# Patient Record
Sex: Male | Born: 1965 | Race: White | Hispanic: No | Marital: Married | Smoking: Former smoker
Health system: Southern US, Community
[De-identification: ages and names within clinical notes are randomized; demographics above are authoritative.]

## PROBLEM LIST (undated history)

## (undated) DIAGNOSIS — I4891 Unspecified atrial fibrillation: Secondary | ICD-10-CM

## (undated) DIAGNOSIS — I38 Endocarditis, valve unspecified: Secondary | ICD-10-CM

## (undated) DIAGNOSIS — I10 Essential (primary) hypertension: Secondary | ICD-10-CM

## (undated) DIAGNOSIS — J449 Chronic obstructive pulmonary disease, unspecified: Secondary | ICD-10-CM

## (undated) DIAGNOSIS — K219 Gastro-esophageal reflux disease without esophagitis: Secondary | ICD-10-CM

## (undated) DIAGNOSIS — E663 Overweight: Secondary | ICD-10-CM

## (undated) DIAGNOSIS — G4730 Sleep apnea, unspecified: Secondary | ICD-10-CM

## (undated) DIAGNOSIS — R001 Bradycardia, unspecified: Secondary | ICD-10-CM

## (undated) HISTORY — PX: OTHER SURGICAL HISTORY: SHX169

## (undated) HISTORY — DX: Gastro-esophageal reflux disease without esophagitis: K21.9

## (undated) HISTORY — DX: Overweight: E66.3

## (undated) SURGERY — Surgical Case
Anesthesia: *Unknown

---

## 2004-11-29 ENCOUNTER — Emergency Department: Payer: Self-pay | Admitting: Emergency Medicine

## 2004-11-29 ENCOUNTER — Other Ambulatory Visit: Payer: Self-pay

## 2005-05-27 ENCOUNTER — Emergency Department: Payer: Self-pay | Admitting: Emergency Medicine

## 2005-05-27 ENCOUNTER — Other Ambulatory Visit: Payer: Self-pay

## 2005-09-12 ENCOUNTER — Emergency Department: Payer: Self-pay | Admitting: Emergency Medicine

## 2005-09-12 ENCOUNTER — Other Ambulatory Visit: Payer: Self-pay

## 2005-09-13 ENCOUNTER — Other Ambulatory Visit: Payer: Self-pay

## 2007-09-12 ENCOUNTER — Emergency Department: Payer: Self-pay | Admitting: Emergency Medicine

## 2007-11-22 ENCOUNTER — Emergency Department: Payer: Self-pay | Admitting: Emergency Medicine

## 2007-11-22 ENCOUNTER — Other Ambulatory Visit: Payer: Self-pay

## 2012-03-29 ENCOUNTER — Emergency Department: Payer: Self-pay | Admitting: Emergency Medicine

## 2013-11-29 ENCOUNTER — Emergency Department: Payer: Self-pay | Admitting: Emergency Medicine

## 2013-11-29 LAB — BASIC METABOLIC PANEL
Calcium, Total: 9 mg/dL (ref 8.5–10.1)
Chloride: 102 mmol/L (ref 98–107)
EGFR (Non-African Amer.): >60
Glucose: 95 mg/dL (ref 65–99)
Osmolality: 274 (ref 275–301)
Potassium: 3.9 mmol/L (ref 3.5–5.1)

## 2013-11-29 LAB — TROPONIN I: Troponin-I: <0.02 ng/mL

## 2013-11-29 LAB — CBC
HGB: 15.5 g/dL (ref 13.0–18.0)
MCV: 85 fL (ref 80–100)
Platelet: 275 10*3/uL (ref 150–440)
RBC: 5.49 10*6/uL (ref 4.40–5.90)
RDW: 13.5 % (ref 11.5–14.5)

## 2013-12-06 ENCOUNTER — Emergency Department: Payer: Self-pay | Admitting: Emergency Medicine

## 2013-12-06 LAB — CBC
HGB: 15.5 g/dL (ref 13.0–18.0)
MCHC: 33.5 g/dL (ref 32.0–36.0)
Platelet: 253 10*3/uL (ref 150–440)
RBC: 5.46 10*6/uL (ref 4.40–5.90)
RDW: 13.7 % (ref 11.5–14.5)

## 2013-12-06 LAB — BASIC METABOLIC PANEL
BUN: 15 mg/dL (ref 7–18)
Calcium, Total: 9.3 mg/dL (ref 8.5–10.1)
Chloride: 102 mmol/L (ref 98–107)
Co2: 29 mmol/L (ref 21–32)
Creatinine: 1.41 mg/dL — ABNORMAL HIGH (ref 0.60–1.30)
EGFR (African American): >60
EGFR (Non-African Amer.): 59 — ABNORMAL LOW
Glucose: 105 mg/dL — ABNORMAL HIGH (ref 65–99)
Potassium: 3.9 mmol/L (ref 3.5–5.1)
Sodium: 136 mmol/L (ref 136–145)

## 2014-08-30 ENCOUNTER — Emergency Department: Payer: Self-pay | Admitting: Emergency Medicine

## 2014-08-30 LAB — COMPREHENSIVE METABOLIC PANEL
ALK PHOS: 86 U/L
Albumin: 3.6 g/dL (ref 3.4–5.0)
Anion Gap: 4 — ABNORMAL LOW (ref 7–16)
BILIRUBIN TOTAL: 1.6 mg/dL — AB (ref 0.2–1.0)
BUN: 16 mg/dL (ref 7–18)
CALCIUM: 8.6 mg/dL (ref 8.5–10.1)
CHLORIDE: 104 mmol/L (ref 98–107)
Co2: 31 mmol/L (ref 21–32)
Creatinine: 1.3 mg/dL (ref 0.60–1.30)
Glucose: 101 mg/dL — ABNORMAL HIGH (ref 65–99)
OSMOLALITY: 279 (ref 275–301)
Potassium: 3.9 mmol/L (ref 3.5–5.1)
SGOT(AST): 33 U/L (ref 15–37)
SGPT (ALT): 26 U/L
Sodium: 139 mmol/L (ref 136–145)
Total Protein: 7.4 g/dL (ref 6.4–8.2)

## 2014-08-30 LAB — CBC WITH DIFFERENTIAL/PLATELET
Basophil #: 0 10*3/uL (ref 0.0–0.1)
Basophil %: 0.4 %
EOS PCT: 1.3 %
Eosinophil #: 0.1 10*3/uL (ref 0.0–0.7)
HCT: 45.6 % (ref 40.0–52.0)
HGB: 14.9 g/dL (ref 13.0–18.0)
LYMPHS PCT: 10 %
Lymphocyte #: 0.8 10*3/uL — ABNORMAL LOW (ref 1.0–3.6)
MCH: 28.6 pg (ref 26.0–34.0)
MCHC: 32.7 g/dL (ref 32.0–36.0)
MCV: 88 fL (ref 80–100)
MONOS PCT: 11 %
Monocyte #: 0.9 x10 3/mm (ref 0.2–1.0)
Neutrophil #: 6.4 10*3/uL (ref 1.4–6.5)
Neutrophil %: 77.3 %
PLATELETS: 248 10*3/uL (ref 150–440)
RBC: 5.21 10*6/uL (ref 4.40–5.90)
RDW: 14.3 % (ref 11.5–14.5)
WBC: 8.3 10*3/uL (ref 3.8–10.6)

## 2014-08-30 LAB — PROTIME-INR
INR: 1.1
Prothrombin Time: 14.1 secs (ref 11.5–14.7)

## 2014-08-30 LAB — APTT: ACTIVATED PTT: 30.3 s (ref 23.6–35.9)

## 2014-12-19 ENCOUNTER — Ambulatory Visit: Payer: Self-pay | Admitting: Family Medicine

## 2014-12-19 LAB — RAPID STREP-A WITH REFLX: Micro Text Report: NEGATIVE

## 2014-12-22 LAB — BETA STREP CULTURE(ARMC)

## 2015-06-07 ENCOUNTER — Emergency Department
Admission: EM | Admit: 2015-06-07 | Discharge: 2015-06-07 | Disposition: A | Discharge status: Home / Self Care | Payer: Medicaid Other | Attending: Emergency Medicine | Admitting: Emergency Medicine

## 2015-06-07 ENCOUNTER — Encounter: Payer: Self-pay | Admitting: Emergency Medicine

## 2015-06-07 DIAGNOSIS — Z0000 Encounter for general adult medical examination without abnormal findings: Secondary | ICD-10-CM

## 2015-06-07 DIAGNOSIS — R251 Tremor, unspecified: Secondary | ICD-10-CM | POA: Diagnosis present

## 2015-06-07 DIAGNOSIS — R001 Bradycardia, unspecified: Secondary | ICD-10-CM | POA: Insufficient documentation

## 2015-06-07 LAB — CBC WITH DIFFERENTIAL/PLATELET
Basophils Absolute: 0 10*3/uL (ref 0–0.1)
Basophils Relative: 1 %
EOS ABS: 0 10*3/uL (ref 0–0.7)
Eosinophils Relative: 1 %
HCT: 43.3 % (ref 40.0–52.0)
Hemoglobin: 14.3 g/dL (ref 13.0–18.0)
Lymphocytes Relative: 25 %
Lymphs Abs: 1.4 10*3/uL (ref 1.0–3.6)
MCH: 28.8 pg (ref 26.0–34.0)
MCHC: 33.2 g/dL (ref 32.0–36.0)
MCV: 86.9 fL (ref 80.0–100.0)
Monocytes Absolute: 0.7 10*3/uL (ref 0.2–1.0)
Monocytes Relative: 13 %
NEUTROS ABS: 3.4 10*3/uL (ref 1.4–6.5)
Neutrophils Relative %: 60 %
Platelets: 221 10*3/uL (ref 150–440)
RBC: 4.98 MIL/uL (ref 4.40–5.90)
RDW: 13.7 % (ref 11.5–14.5)
WBC: 5.5 10*3/uL (ref 3.8–10.6)

## 2015-06-07 LAB — COMPREHENSIVE METABOLIC PANEL
ALK PHOS: 67 U/L (ref 38–126)
ALT: 15 U/L — AB (ref 17–63)
AST: 27 U/L (ref 15–41)
Albumin: 4.2 g/dL (ref 3.5–5.0)
Anion gap: 7 (ref 5–15)
BILIRUBIN TOTAL: 1.8 mg/dL — AB (ref 0.3–1.2)
BUN: 20 mg/dL (ref 6–20)
CO2: 28 mmol/L (ref 22–32)
Calcium: 9.2 mg/dL (ref 8.9–10.3)
Chloride: 105 mmol/L (ref 101–111)
Creatinine, Ser: 1.43 mg/dL — ABNORMAL HIGH (ref 0.61–1.24)
GFR calc non Af Amer: 57 mL/min — ABNORMAL LOW (ref >60)
GLUCOSE: 104 mg/dL — AB (ref 65–99)
POTASSIUM: 3.6 mmol/L (ref 3.5–5.1)
SODIUM: 140 mmol/L (ref 135–145)
TOTAL PROTEIN: 7.2 g/dL (ref 6.5–8.1)

## 2015-06-07 LAB — URINALYSIS COMPLETE WITH MICROSCOPIC (ARMC ONLY)
BACTERIA UA: NONE SEEN
Bilirubin Urine: NEGATIVE
Glucose, UA: NEGATIVE mg/dL
Hgb urine dipstick: NEGATIVE
LEUKOCYTES UA: NEGATIVE
NITRITE: NEGATIVE
PH: 5 (ref 5.0–8.0)
PROTEIN: 30 mg/dL — AB
Specific Gravity, Urine: 1.032 — ABNORMAL HIGH (ref 1.005–1.030)
Squamous Epithelial / LPF: NONE SEEN

## 2015-06-07 MED ORDER — SODIUM CHLORIDE 0.9 % IV BOLUS (SEPSIS)
1000.0000 mL | Freq: Once | INTRAVENOUS | Status: AC
  Administered 2015-06-07: 1000 mL via INTRAVENOUS

## 2015-06-07 MED ORDER — ONDANSETRON HCL 4 MG/2ML IJ SOLN
INTRAMUSCULAR | Status: AC
  Filled 2015-06-07: qty 2

## 2015-06-07 NOTE — ED Provider Notes (Signed)
[facility] Emergency Department Provider Note  Time seen: 9:01 AM  I have reviewed the triage vital signs and the nursing notes.   HISTORY  Chief Complaint Shaking    HPI Adrian Flores is a 49 y.o. male with no medical history presents the emergency department feeling dehydrated. According to the patient for the past several days he has been getting shaky at times, and feels that he cannot get enough fluids. He states he has been drinking fluids but does not feel like he is getting enough in. Denies any chest pain, shortness breath, abdominal pain, nausea, vomiting, diarrhea, dysuria. The patient denies substance or alcohol abuse. States his only medical history is depression, denies any SI or HI currently. Patient states he is currently feeling mildly "shaky."       History reviewed. No pertinent past medical history.  There are no active problems to display for this patient.   History reviewed. No pertinent past surgical history.  No current outpatient prescriptions on file.  Allergies Review of patient's allergies indicates no known allergies.  History reviewed. No pertinent family history.  Social History History  Substance Use Topics  . Smoking status: Never Smoker   . Smokeless tobacco: Not on file  . Alcohol Use: No    Review of Systems Constitutional: Negative for fever. Cardiovascular: Negative for chest pain. Respiratory: Negative for shortness of breath. Gastrointestinal: Negative for abdominal pain, vomiting and diarrhea. Genitourinary: Negative for dysuria. Musculoskeletal: Negative for back pain.  10-point ROS otherwise negative.  ____________________________________________   PHYSICAL EXAM:  VITAL SIGNS: ED Triage Vitals  Enc Vitals Group     BP 06/07/15 0851 119/82 mmHg     Pulse Rate 06/07/15 0851 59     Resp 06/07/15 0851 18     Temp 06/07/15 0851 98.9 F (37.2 C)     Temp Source 06/07/15 0851 Oral   SpO2 06/07/15 0851 98 %     Weight 06/07/15 0851 185 lb (83.915 kg)     Height 06/07/15 0851  (1.753 m)     Head Cir --      Peak Flow --      Pain Score --      Pain Loc --      Pain Edu? --      Excl. in GC? --     Constitutional: Alert and oriented. Well appearing and in no distress. Eyes: Normal exam ENT   Mouth/Throat: Mucous membranes are moist. Cardiovascular: Normal rate, regular rhythm. No murmur Respiratory: Normal respiratory effort without tachypnea nor retractions. Breath sounds are clear  Gastrointestinal: Soft and nontender. No distention.  Musculoskeletal: Nontender with normal range of motion in all extremities.  Neurologic:  Normal speech and language. No gross focal neurologic deficits  Skin:  Skin is warm, dry and intact.  Psychiatric: Mood and affect are normal. Speech and behavior are normal.   ____________________________________________    INITIAL IMPRESSION / ASSESSMENT AND PLAN / ED COURSE  Pertinent labs & imaging results that were available during my care of the patient were reviewed by me and considered in my medical decision making (see chart for details).  Patient with vague complaints of being "shaky." States he feels that he is dehydrated that he has been drinking fluids. We will check labs, IV hydrate until we have laboratory results. No specific complaints per patient at this time.   Labs are within normal limits. Patient's heart rate has remained steadily bradycardic in the 40s to low  50s. In review of patient's old records his heart rate tends to run low in the low 50s on average. I discussed this with the patient, will follow up with his primary care doctor. I have also discussed possibly following up with a cardiologist for a Holter monitor if patient develops any dizziness, or lightheadedness. Patient states he feels fine currently. Labs are normal, I discussed increasing by mouth fluids and follow up with his primary care doctor who  is agreeable. ____________________________________________   FINAL CLINICAL IMPRESSION(S) / ED DIAGNOSES  Bradycardia Normal exam   Minna Antis, MD 06/07/15 1326

## 2015-06-07 NOTE — Discharge Instructions (Signed)
Medical Screening Exam A medical screening exam has been done. This exam helps find the cause of your problem and determines whether you need emergency treatment. Your exam has shown that you do not need emergency treatment at this point. It is safe for you to go to your caregiver's office or clinic for treatment. You should make an appointment today to see your caregiver as soon as he or she is available. Depending on your illness, your symptoms and condition can change over time. If your condition gets worse or you develop new or troubling symptoms before you see your caregiver, you should return to the emergency department for further evaluation.  Document Released: 01/14/2005 Document Revised: 02/29/2012 Document Reviewed: 08/26/2011 [facility] Patient Information 2015 [place], [place]. This information is not intended to replace advice given to you by your health care provider. Make sure you discuss any questions you have with your health care provider.  Health Maintenance A healthy lifestyle and preventative care can promote health and wellness.  Maintain regular health, dental, and eye exams.  Eat a healthy diet. Foods like vegetables, fruits, whole grains, low-fat dairy products, and lean protein foods contain the nutrients you need and are low in calories. Decrease your intake of foods high in solid fats, added sugars, and salt. Get information about a proper diet from your health care provider, if necessary.  Regular physical exercise is one of the most important things you can do for your health. Most adults should get at least 150 minutes of moderate-intensity exercise (any activity that increases your heart rate and causes you to sweat) each week. In addition, most adults need muscle-strengthening exercises on 2 or more days a week.   Maintain a healthy weight. The body mass index (BMI) is a screening tool to identify possible weight problems. It provides an estimate of body fat based on  height and weight. Your health care provider can find your BMI and can help you achieve or maintain a healthy weight. For males 20 years and older:  A BMI below 18.5 is considered underweight.  A BMI of 18.5 to 24.9 is normal.  A BMI of 25 to 29.9 is considered overweight.  A BMI of 30 and above is considered obese.  Maintain normal blood lipids and cholesterol by exercising and minimizing your intake of saturated fat. Eat a balanced diet with plenty of fruits and vegetables. Blood tests for lipids and cholesterol should begin at age 53 and be repeated every 5 years. If your lipid or cholesterol levels are high, you are over age 11, or you are at high risk for heart disease, you may need your cholesterol levels checked more frequently.Ongoing high lipid and cholesterol levels should be treated with medicines if diet and exercise are not working.  If you smoke, find out from your health care provider how to quit. If you do not use tobacco, do not start.  Lung cancer screening is recommended for adults aged 55-80 years who are at high risk for developing lung cancer because of a history of smoking. A yearly low-dose CT scan of the lungs is recommended for people who have at least a 30-pack-year history of smoking and are current smokers or have quit within the past 15 years. A pack year of smoking is smoking an average of 1 pack of cigarettes a day for 1 year (for example, a 30-pack-year history of smoking could mean smoking 1 pack a day for 30 years or 2 packs a day for 15 years).  Yearly screening should continue until the smoker has stopped smoking for at least 15 years. Yearly screening should be stopped for people who develop a health problem that would prevent them from having lung cancer treatment.  If you choose to drink alcohol, do not have more than 2 drinks per day. One drink is considered to be 12 oz (360 mL) of beer, 5 oz (150 mL) of wine, or 1.5 oz (45 mL) of liquor.  Avoid the use of  street drugs. Do not share needles with anyone. Ask for help if you need support or instructions about stopping the use of drugs.  High blood pressure causes heart disease and increases the risk of stroke. Blood pressure should be checked at least every 1-2 years. Ongoing high blood pressure should be treated with medicines if weight loss and exercise are not effective.  If you are 53-8 years old, ask your health care provider if you should take aspirin to prevent heart disease.  Diabetes screening involves taking a blood sample to check your fasting blood sugar level. This should be done once every 3 years after age 34 if you are at a normal weight and without risk factors for diabetes. Testing should be considered at a younger age or be carried out more frequently if you are overweight and have at least 1 risk factor for diabetes.  Colorectal cancer can be detected and often prevented. Most routine colorectal cancer screening begins at the age of 30 and continues through age 46. However, your health care provider may recommend screening at an earlier age if you have risk factors for colon cancer. On a yearly basis, your health care provider may provide home test kits to check for hidden blood in the stool. A small camera at the end of a tube may be used to directly examine the colon (sigmoidoscopy or colonoscopy) to detect the earliest forms of colorectal cancer. Talk to your health care provider about this at age 17 when routine screening begins. A direct exam of the colon should be repeated every 5-10 years through age 11, unless early forms of precancerous polyps or small growths are found.  People who are at an increased risk for hepatitis B should be screened for this virus. You are considered at high risk for hepatitis B if:  You were born in a country where hepatitis B occurs often. Talk with your health care provider about which countries are considered high risk.  Your parents were born in  a high-risk country and you have not received a shot to protect against hepatitis B (hepatitis B vaccine).  You have HIV or AIDS.  You use needles to inject street drugs.  You live with, or have sex with, someone who has hepatitis B.  You are a man who has sex with other men (MSM).  You get hemodialysis treatment.  You take certain medicines for conditions like cancer, organ transplantation, and autoimmune conditions.  Hepatitis C blood testing is recommended for all people born from [birth year] through 1965 and any individual with known risk factors for hepatitis C.  Healthy men should no longer receive prostate-specific antigen (PSA) blood tests as part of routine cancer screening. Talk to your health care provider about prostate cancer screening.  Testicular cancer screening is not recommended for adolescents or adult males who have no symptoms. Screening includes self-exam, a health care provider exam, and other screening tests. Consult with your health care provider about any symptoms you have or any concerns you have  about testicular cancer.  Practice safe sex. Use condoms and avoid high-risk sexual practices to reduce the spread of sexually transmitted infections (STIs).  You should be screened for STIs, including gonorrhea and chlamydia if:  You are sexually active and are younger than 24 years.  You are older than 24 years, and your health care provider tells you that you are at risk for this type of infection.  Your sexual activity has changed since you were last screened, and you are at an increased risk for chlamydia or gonorrhea. Ask your health care provider if you are at risk.  If you are at risk of being infected with HIV, it is recommended that you take a prescription medicine daily to prevent HIV infection. This is called pre-exposure prophylaxis (PrEP). You are considered at risk if:  You are a man who has sex with other men (MSM).  You are a heterosexual man who is  sexually active with multiple partners.  You take drugs by injection.  You are sexually active with a partner who has HIV.  Talk with your health care provider about whether you are at high risk of being infected with HIV. If you choose to begin PrEP, you should first be tested for HIV. You should then be tested every 3 months for as long as you are taking PrEP.  Use sunscreen. Apply sunscreen liberally and repeatedly throughout the day. You should seek shade when your shadow is shorter than you. Protect yourself by wearing long sleeves, pants, a wide-brimmed hat, and sunglasses year round whenever you are outdoors.  Tell your health care provider of new moles or changes in moles, especially if there is a change in shape or color. Also, tell your health care provider if a mole is larger than the size of a pencil eraser.  A one-time screening for abdominal aortic aneurysm (AAA) and surgical repair of large AAAs by ultrasound is recommended for men aged 65-75 years who are current or former smokers.  Stay current with your vaccines (immunizations). Document Released: 06/04/2008 Document Revised: 12/12/2013 Document Reviewed: 05/04/2011 [facility] Patient Information 2015 [place], [place]. This information is not intended to replace advice given to you by your health care provider. Make sure you discuss any questions you have with your health care provider.     You have been seen in the emergency department today. Your workup including labs and urinalysis appear to show normal results. Please drink plenty of fluids over the next several days, and follow-up with your primary care doctor soon as possible. Please return to the emergency department for any symptoms personally concerning to yourself.

## 2015-06-07 NOTE — ED Notes (Signed)
Reports feeling shaky and dehydrated x 2 days

## 2015-06-14 ENCOUNTER — Encounter: Payer: Self-pay | Admitting: Emergency Medicine

## 2015-06-14 ENCOUNTER — Other Ambulatory Visit: Payer: Self-pay

## 2015-06-14 DIAGNOSIS — R5383 Other fatigue: Secondary | ICD-10-CM | POA: Diagnosis present

## 2015-06-14 LAB — BASIC METABOLIC PANEL
ANION GAP: 8 (ref 5–15)
BUN: 15 mg/dL (ref 6–20)
CALCIUM: 9.3 mg/dL (ref 8.9–10.3)
CHLORIDE: 104 mmol/L (ref 101–111)
CO2: 28 mmol/L (ref 22–32)
CREATININE: 1.14 mg/dL (ref 0.61–1.24)
GFR calc non Af Amer: >60 mL/min (ref >60)
Glucose, Bld: 111 mg/dL — ABNORMAL HIGH (ref 65–99)
Potassium: 3.8 mmol/L (ref 3.5–5.1)
SODIUM: 140 mmol/L (ref 135–145)

## 2015-06-14 LAB — CBC
HEMATOCRIT: 44.4 % (ref 40.0–52.0)
HEMOGLOBIN: 14.5 g/dL (ref 13.0–18.0)
MCH: 28.6 pg (ref 26.0–34.0)
MCHC: 32.7 g/dL (ref 32.0–36.0)
MCV: 87.4 fL (ref 80.0–100.0)
Platelets: 228 10*3/uL (ref 150–440)
RBC: 5.08 MIL/uL (ref 4.40–5.90)
RDW: 14.3 % (ref 11.5–14.5)
WBC: 7 10*3/uL (ref 3.8–10.6)

## 2015-06-14 NOTE — ED Notes (Addendum)
Patient has been feeling fatigue and cold for couple of days. Patient reports worse tonight. Patient was seen by his pcp on Friday and had a heart rate of 45. Patient's pcp told him to come to the ED to be evaluated.

## 2015-06-15 ENCOUNTER — Emergency Department
Admission: EM | Admit: 2015-06-15 | Discharge: 2015-06-15 | Discharge status: Against Medical Advice | Payer: Medicaid Other | Attending: Emergency Medicine | Admitting: Emergency Medicine

## 2015-06-15 NOTE — ED Notes (Signed)
Called from lobby with no reply 

## 2016-09-09 ENCOUNTER — Emergency Department: Payer: Medicaid Other

## 2016-09-09 ENCOUNTER — Emergency Department
Admission: EM | Admit: 2016-09-09 | Discharge: 2016-09-09 | Disposition: A | Discharge status: Home / Self Care | Payer: Medicaid Other | Attending: Emergency Medicine | Admitting: Emergency Medicine

## 2016-09-09 DIAGNOSIS — Y929 Unspecified place or not applicable: Secondary | ICD-10-CM | POA: Insufficient documentation

## 2016-09-09 DIAGNOSIS — X58XXXA Exposure to other specified factors, initial encounter: Secondary | ICD-10-CM | POA: Insufficient documentation

## 2016-09-09 DIAGNOSIS — Y9389 Activity, other specified: Secondary | ICD-10-CM | POA: Insufficient documentation

## 2016-09-09 DIAGNOSIS — M545 Low back pain: Secondary | ICD-10-CM | POA: Diagnosis present

## 2016-09-09 DIAGNOSIS — Z87891 Personal history of nicotine dependence: Secondary | ICD-10-CM | POA: Insufficient documentation

## 2016-09-09 DIAGNOSIS — S39012A Strain of muscle, fascia and tendon of lower back, initial encounter: Secondary | ICD-10-CM | POA: Diagnosis not present

## 2016-09-09 DIAGNOSIS — Y999 Unspecified external cause status: Secondary | ICD-10-CM | POA: Diagnosis not present

## 2016-09-09 MED ORDER — CYCLOBENZAPRINE HCL 10 MG PO TABS: 10.0000 mg | ORAL_TABLET | Freq: Three times a day (TID) | ORAL | 0 refills | Status: DC | PRN

## 2016-09-09 MED ORDER — KETOROLAC TROMETHAMINE 60 MG/2ML IM SOLN
60.0000 mg | Freq: Once | INTRAMUSCULAR | Status: AC
  Administered 2016-09-09: 60 mg via INTRAMUSCULAR
  Filled 2016-09-09: qty 2

## 2016-09-09 MED ORDER — OXYCODONE-ACETAMINOPHEN 5-325 MG PO TABS: 1.0000 | ORAL_TABLET | Freq: Four times a day (QID) | ORAL | 0 refills | Status: DC | PRN

## 2016-09-09 NOTE — ED Triage Notes (Signed)
Pt in with co lower back pain worse when he moves or walks. Denies any injury has tried motrin without relief.

## 2016-09-09 NOTE — ED Provider Notes (Signed)
[facility] Emergency Department Provider Note   ____________________________________________   None    (approximate)  I have reviewed the triage vital signs and the nursing notes.   HISTORY  Chief Complaint Back Pain    HPI Adrian Flores is a 50 y.o. male patient complaining of low back pain for 2-3 days. Patient denies any provocative incident for this complaint. Patient denies any bladder or bowel dysfunction. Patient stated pain increases when he walks and also when he flexes back. Patient stated pain is not relieved over-the-counter ibuprofen. No other palliative measures for this complaint. Patient rates his pain as a 9/10. Patient describes pain as "sharp". No past medical history on file.  There are no active problems to display for this patient.   No past surgical history on file.  Prior to Admission medications   Medication Sig Start Date End Date Taking? Authorizing Provider  FLUoxetine (PROZAC) 20 MG capsule Take 20 mg by mouth daily.    Historical Provider, MD    Allergies Review of patient's allergies indicates no known allergies.  No family history on file.  Social History Social History  Substance Use Topics  . Smoking status: Former Games developer  . Smokeless tobacco: Not on file  . Alcohol use No    Review of Systems Constitutional: No fever/chills Eyes: No visual changes. ENT: No sore throat. Cardiovascular: Denies chest pain. Respiratory: Denies shortness of breath. Gastrointestinal: No abdominal pain.  No nausea, no vomiting.  No diarrhea.  No constipation. Genitourinary: Negative for dysuria. Musculoskeletal: Positive for back pain. Skin: Negative for rash. Neurological: Negative for headaches, focal weakness or numbness.   ____________________________________________   PHYSICAL EXAM:  VITAL SIGNS: ED Triage Vitals  Enc Vitals Group     BP 09/09/16 2128 119/79     Pulse Rate 09/09/16 2128 60     Resp  09/09/16 2128 18     Temp 09/09/16 2128 97.7 F (36.5 C)     Temp Source 09/09/16 2128 Oral     SpO2 09/09/16 2128 98 %     Weight 09/09/16 2128 193 lb (87.5 kg)     Height 09/09/16 2128 5\' 9"  (1.753 m)     Head Circumference --      Peak Flow --      Pain Score 09/09/16 2129 9     Pain Loc --      Pain Edu? --      Excl. in GC? --     Constitutional: Alert and oriented. Well appearing and in no acute distress. Eyes: Conjunctivae are normal. PERRL. EOMI. Head: Atraumatic. Nose: No congestion/rhinnorhea. Mouth/Throat: Mucous membranes are moist.  Oropharynx non-erythematous. Neck: No stridor.  No cervical spine tenderness to palpation. Hematological/Lymphatic/Immunilogical: No cervical lymphadenopathy. Cardiovascular: Normal rate, regular rhythm. Grossly normal heart sounds.  Good peripheral circulation. Respiratory: Normal respiratory effort.  No retractions. Lungs CTAB. Gastrointestinal: Soft and nontender. No distention. No abdominal bruits. No CVA tenderness. Musculoskeletal: Deformity of the lumbar spine. Patient is moderate guarding palpation L3-S1. Patient had negative straight leg test.  Neurologic:  Normal speech and language. No gross focal neurologic deficits are appreciated. No gait instability. Skin:  Skin is warm, dry and intact. No rash noted. Psychiatric: Mood and affect are normal. Speech and behavior are normal.  ____________________________________________   LABS (all labs ordered are listed, but only abnormal results are displayed)  Labs Reviewed - No data to display ____________________________________________  EKG   ____________________________________________  RADIOLOGY  No acute findings on  x-ray of the lumbar spine. ____________________________________________   PROCEDURES  Procedure(s) performed: None  Procedures  Critical Care performed: No  ____________________________________________   INITIAL IMPRESSION / ASSESSMENT AND PLAN /  ED COURSE  Pertinent labs & imaging results that were available during my care of the patient were reviewed by me and considered in my medical decision making (see chart for details).  Low back pain. Discussed x-ray finding with patient. Patient given discharge Instructions. Patient given a prescription for tramadol Flexeril. Patient advised follow-up family doctor complaint persists.  Clinical Course     ____________________________________________   FINAL CLINICAL IMPRESSION(S) / ED DIAGNOSES  Final diagnoses:  Low back strain, initial encounter      NEW MEDICATIONS STARTED DURING THIS VISIT:  New Prescriptions   No medications on file     Note:  This document was prepared using Dragon voice recognition software and may include unintentional dictation errors.    Joni ReiningRonald K Smith, PA-C 09/09/16 2235    Nita Sicklearolina Veronese, MD 09/09/16 [phone]

## 2017-12-12 ENCOUNTER — Encounter: Payer: Self-pay | Admitting: Emergency Medicine

## 2017-12-12 ENCOUNTER — Other Ambulatory Visit: Payer: Self-pay

## 2017-12-12 ENCOUNTER — Emergency Department
Admission: EM | Admit: 2017-12-12 | Discharge: 2017-12-12 | Disposition: A | Discharge status: Home / Self Care | Payer: Medicaid Other | Attending: Emergency Medicine | Admitting: Emergency Medicine

## 2017-12-12 ENCOUNTER — Emergency Department: Payer: Medicaid Other

## 2017-12-12 DIAGNOSIS — R072 Precordial pain: Secondary | ICD-10-CM | POA: Diagnosis not present

## 2017-12-12 DIAGNOSIS — R079 Chest pain, unspecified: Secondary | ICD-10-CM | POA: Diagnosis present

## 2017-12-12 DIAGNOSIS — Z87891 Personal history of nicotine dependence: Secondary | ICD-10-CM | POA: Insufficient documentation

## 2017-12-12 DIAGNOSIS — Z79899 Other long term (current) drug therapy: Secondary | ICD-10-CM | POA: Insufficient documentation

## 2017-12-12 LAB — CBC
HCT: 44.5 % (ref 40.0–52.0)
Hemoglobin: 14.8 g/dL (ref 13.0–18.0)
MCH: 29.3 pg (ref 26.0–34.0)
MCHC: 33.2 g/dL (ref 32.0–36.0)
MCV: 88.2 fL (ref 80.0–100.0)
Platelets: 272 10*3/uL (ref 150–440)
RBC: 5.04 MIL/uL (ref 4.40–5.90)
RDW: 13.3 % (ref 11.5–14.5)
WBC: 5 10*3/uL (ref 3.8–10.6)

## 2017-12-12 LAB — BASIC METABOLIC PANEL
Anion gap: 7 (ref 5–15)
BUN: 17 mg/dL (ref 6–20)
CALCIUM: 9.2 mg/dL (ref 8.9–10.3)
CO2: 29 mmol/L (ref 22–32)
Chloride: 102 mmol/L (ref 101–111)
Creatinine, Ser: 1.38 mg/dL — ABNORMAL HIGH (ref 0.61–1.24)
GFR calc non Af Amer: 58 mL/min — ABNORMAL LOW (ref >60)
Glucose, Bld: 99 mg/dL (ref 65–99)
Potassium: 3.8 mmol/L (ref 3.5–5.1)
Sodium: 138 mmol/L (ref 135–145)

## 2017-12-12 LAB — TROPONIN I

## 2017-12-12 MED ORDER — ISOSORBIDE DINITRATE 30 MG PO TABS: 30.0000 mg | ORAL_TABLET | Freq: Once | ORAL | Status: DC

## 2017-12-12 MED ORDER — ISOSORBIDE MONONITRATE ER 60 MG PO TB24
30.0000 mg | ORAL_TABLET | Freq: Every day | ORAL | Status: DC
  Administered 2017-12-12: 30 mg via ORAL
  Filled 2017-12-12: qty 1

## 2017-12-12 MED ORDER — ASPIRIN 81 MG PO CHEW
81.0000 mg | CHEWABLE_TABLET | Freq: Once | ORAL | Status: AC
  Administered 2017-12-12: 81 mg via ORAL
  Filled 2017-12-12: qty 1

## 2017-12-12 MED ORDER — ISOSORBIDE MONONITRATE ER 30 MG PO TB24: 30.0000 mg | ORAL_TABLET | Freq: Every day | ORAL | 22 refills | Status: DC

## 2017-12-12 NOTE — ED Notes (Signed)
Pt discharged to call via wheelchair with wife. NAD. VSS. No longer having chest pain. Reviewed discharge instructions, RX and Follow up Appt with Dr Park BreedKahn. No other questions or Concerns voiced.

## 2017-12-12 NOTE — Discharge Instructions (Addendum)
please take a baby aspirin a day and use the isosorbide one pill a day. Dr. Welton FlakesKhan will see you in the office at 10:00 tomorrow. Take it easy today and tomorrow until you see him. Return if the chest pain comes back or worsens at all. Call 911 if the chest pain comes on and doesn't go away when you rest

## 2017-12-12 NOTE — ED Triage Notes (Signed)
Presents with a 2 day hx oh mid chest discomfort  describeas as pressure

## 2017-12-12 NOTE — ED Provider Notes (Signed)
[facility] Emergency Department Provider Note   ____________________________________________   First MD Initiated Contact with Patient 12/12/17 1208     (approximate)  I have reviewed the triage vital signs and the nursing notes.   HISTORY  Chief Complaint Chest Pain    HPI Adrian Flores is a 51 y.o. male patient reports his heart rate is always low. He says he's taking flecainide for this. Dr. Alger MemosShahkaut Khan is his cardiologist. His computer record does not show flecainide however. Patient reports that when he walks he gets chest heaviness and a little bit of shortness of breath. it comes on after his been walking for a minute or 2 and then if he stops walking and will go away after minute or 2. He has not had this happen before.he reports he did bang his chest against a metal object when he bent over. He has a little bit of chest pain on palpation of his chest but this is not what he is feeling.   History reviewed. No pertinent past medical history.  There are no active problems to display for this patient.   History reviewed. No pertinent surgical history.  Prior to Admission medications   Medication Sig Start Date End Date Taking? Authorizing Provider  cyclobenzaprine (FLEXERIL) 10 MG tablet Take 1 tablet (10 mg total) by mouth 3 (three) times daily as needed. 09/09/16   Joni ReiningSmith, Ronald K, PA-C  FLUoxetine (PROZAC) 20 MG capsule Take 20 mg by mouth daily.    [provider]  oxyCODONE-acetaminophen (ROXICET) 5-325 MG tablet Take 1 tablet by mouth every 6 (six) hours as needed for moderate pain. 09/09/16   Joni ReiningSmith, Ronald K, PA-C    Allergies Patient has no known allergies.  History reviewed. No pertinent family history.  Social History Social History   Tobacco Use  . Smoking status: Former Games developermoker  . Smokeless tobacco: Never Used  Substance Use Topics  . Alcohol use: No  . Drug use: Not on file    Review of  Systems  Constitutional: No fever/chills Eyes: No visual changes. ENT: No sore throat. Cardiovascular: see history of present illness. Respiratory: see history of present illness Gastrointestinal: No abdominal pain.  No nausea, no vomiting.  No diarrhea.  No constipation. Genitourinary: Negative for dysuria. Musculoskeletal: Negative for back pain. Skin: Negative for rash. Neurological: Negative for headaches, focal weakness   ____________________________________________   PHYSICAL EXAM:  VITAL SIGNS: ED Triage Vitals  Enc Vitals Group     BP 12/12/17 1112 118/77     Pulse Rate 12/12/17 1112 (!) 54     Resp 12/12/17 1112 20     Temp 12/12/17 1112 98.1 F (36.7 C)     Temp Source 12/12/17 1112 Oral     SpO2 12/12/17 1112 98 %     Weight 12/12/17 1110 190 lb (86.2 kg)     Height 12/12/17 1110 5\' 9"  (1.753 m)     Head Circumference --      Peak Flow --      Pain Score 12/12/17 1110 5     Pain Loc --      Pain Edu? --      Excl. in GC? --     Constitutional: Alert and oriented. Well appearing and in no acute distress. Eyes: Conjunctivae are normal. Head: Atraumatic. Nose: No congestion/rhinnorhea. Mouth/Throat: Mucous membranes are moist.  Oropharynx non-erythematous. Neck: No stridor.  Cardiovascular: Normal rate, regular rhythm. Grossly normal heart sounds.  Good peripheral circulation.  Respiratory: Normal respiratory effort.  No retractions. Lungs CTAB. Gastrointestinal: Soft and nontender. No distention. No abdominal bruits. No CVA tenderness. Musculoskeletal: No lower extremity tenderness nor edema.  No joint effusions. Neurologic:  Normal speech and language. No gross focal neurologic deficits are appreciated.  Skin:  Skin is warm, dry and intact. No rash noted. Psychiatric: Mood and affect are normal. Speech and behavior are normal.  ____________________________________________   LABS (all labs ordered are listed, but only abnormal results are  displayed)  Labs Reviewed  BASIC METABOLIC PANEL - Abnormal; Notable for the following components:      Result Value   Creatinine, Ser 1.38 (*)    GFR calc non Af Amer 58 (*)    All other components within normal limits  CBC  TROPONIN I   ____________________________________________  EKG  EKG read and interpreted by me shows sinus bradycardia rate of 54 normal axis no acute ST-T wave changes ____________________________________________  RADIOLOGY  Dg Chest 2 View  Result Date: 12/12/2017 CLINICAL DATA:  Chest pain. EXAM: CHEST  2 VIEW COMPARISON:  11/29/2013 FINDINGS: The heart size and mediastinal contours are within normal limits. There is no evidence of pulmonary edema, consolidation, pneumothorax, nodule or pleural fluid. The visualized skeletal structures are unremarkable. IMPRESSION: No active cardiopulmonary disease. Electronically Signed   By: Irish LackGlenn  Yamagata M.D.   On: 12/12/2017 11:45   chest x-ray shows no acute change. As well as the radiologist ____________________________________________   PROCEDURES  Procedure(s) performed:   Procedures  Critical Care performed:   ____________________________________________   INITIAL IMPRESSION / ASSESSMENT AND PLAN / ED COURSE  old records were reviewed including old EKGs are no changes I can see in the old EKGs compared today. Chest x-ray again is normal enzymes are negative I discussed the patient with Dr. Welton FlakesKhan . Dr. Welton FlakesKhan can see him tomorrow at 10:00 in the office once. Put him on aspirin 81 mg a day and isosorbide 30 mg a day as well. In view of the normal EKG and enzymes a sounds appropriate and I will have him follow with Dr. Welton FlakesKhan  tomorrow      ____________________________________________   FINAL CLINICAL IMPRESSION(S) / ED DIAGNOSES  Final diagnoses:  Precordial pain     ED Discharge Orders    None       Note:  This document was prepared using Dragon voice recognition software and may include  unintentional dictation errors.    Arnaldo NatalMalinda, Paul F, MD 12/12/17 1230

## 2018-12-26 ENCOUNTER — Encounter: Payer: Self-pay | Admitting: Adult Health

## 2018-12-26 ENCOUNTER — Ambulatory Visit: Payer: Medicaid Other | Admitting: Adult Health

## 2018-12-26 VITALS — BP 103/65 | HR 64 | Resp 16 | Ht 69.0 in | Wt 201.0 lb

## 2018-12-26 DIAGNOSIS — Z9989 Dependence on other enabling machines and devices: Secondary | ICD-10-CM

## 2018-12-26 DIAGNOSIS — Z87891 Personal history of nicotine dependence: Secondary | ICD-10-CM | POA: Diagnosis not present

## 2018-12-26 DIAGNOSIS — R001 Bradycardia, unspecified: Secondary | ICD-10-CM | POA: Diagnosis not present

## 2018-12-26 NOTE — Patient Instructions (Signed)
Bradycardia, Adult Bradycardia is a slower-than-normal heartbeat. A normal resting heart rate for an adult ranges from 60 to 100 beats per minute. With bradycardia, the resting heart rate is less than 60 beats per minute. Bradycardia can prevent enough oxygen from reaching certain areas of your body when you are active. It can be serious if it keeps enough oxygen from reaching your brain and other parts of your body. Bradycardia is not a problem for everyone. For some healthy adults, a slow resting heart rate is normal. What are the causes? This condition may be caused by:  A problem with the heart, including: ? A problem with the heart's electrical system, such as a heart block. With a heart block, electrical signals between the chambers of the heart are partially or completely blocked, so they are not able to work as they should. ? A problem with the heart's natural pacemaker (sinus node). ? Heart disease. ? A heart attack. ? Heart damage. ? Lyme disease. ? A heart infection. ? A heart condition that is present at birth (congenital heart defect).  Certain medicines that treat heart conditions.  Certain conditions, such as hypothyroidism and obstructive sleep apnea.  Problems with the balance of chemicals and other substances, like potassium, in the blood.  Trauma.  Radiation therapy. What increases the risk? You are more likely to develop this condition if you:  Are age 65 or older.  Have high blood pressure (hypertension), high cholesterol (hyperlipidemia), or diabetes.  Drink heavily, use tobacco or nicotine products, or use drugs. What are the signs or symptoms? Symptoms of this condition include:  Light-headedness.  Feeling faint or fainting.  Fatigue and weakness.  Trouble with activity or exercise.  Shortness of breath.  Chest pain (angina).  Drowsiness.  Confusion.  Dizziness. How is this diagnosed? This condition may be diagnosed based on:  Your  symptoms.  Your medical history.  A physical exam. During the exam, your health care provider will listen to your heartbeat and check your pulse. To confirm the diagnosis, your health care provider may order tests, such as:  Blood tests.  An electrocardiogram (ECG). This test records the heart's electrical activity. The test can show how fast your heart is beating and whether the heartbeat is steady.  A test in which you wear a portable device (event recorder or Holter monitor) to record your heart's electrical activity while you go about your day.  Anexercise test. How is this treated? Treatment for this condition depends on the cause of the condition and how severe your symptoms are. Treatment may involve:  Treatment of the underlying condition.  Changing your medicines or how much medicine you take.  Having a small, battery-operated device called a pacemaker implanted under the skin. When bradycardia occurs, this device can be used to increase your heart rate and help your heart beat in a regular rhythm. Follow these instructions at home: Lifestyle   Manage any health conditions that contribute to bradycardia as told by your health care provider.  Follow a heart-healthy diet. A nutrition specialist (dietitian) can help educate you about healthy food options and changes.  Follow an exercise program that is approved by your health care provider.  Maintain a healthy weight.  Try to reduce or manage your stress, such as with yoga or meditation. If you need help reducing stress, ask your health care provider.  Do not use any products that contain nicotine or tobacco, such as cigarettes, e-cigarettes, and chewing tobacco. If you need help   quitting, ask your health care provider.  Do not use illegal drugs.  Limit alcohol intake to no more than 1 drink a day for nonpregnant women and 2 drinks a day for men. Be aware of how much alcohol is in your drink. In the [place] one drink  equals one 12 oz bottle of beer (355 mL), one 5 oz glass of wine (148 mL), or one 1 oz glass of hard liquor (44 mL). General instructions  Take over-the-counter and prescription medicines only as told by your health care provider.  Keep all follow-up visits as told by your health care provider. This is important. How is this prevented? In some cases, bradycardia may be prevented by:  Treating underlying medical problems.  Stopping behaviors or medicines that can trigger the condition. Contact a health care provider if you:  Feel light-headed or dizzy.  Almost faint.  Feel weak or are easily fatigued during physical activity.  Experience confusion or have memory problems. Get help right away if:  You faint.  You have: ? An irregular heartbeat (palpitations). ? Chest pain. ? Trouble breathing. Summary  Bradycardia is a slower-than-normal heartbeat. With bradycardia, the resting heart rate is less than 60 beats per minute.  Treatment for this condition depends on the cause.  Manage any health conditions that contribute to bradycardia as told by your health care provider.  Do not use any products that contain nicotine or tobacco, such as cigarettes, e-cigarettes, and chewing tobacco, and limit alcohol intake.  Keep all follow-up visits as told by your health care provider. This is important. This information is not intended to replace advice given to you by your health care provider. Make sure you discuss any questions you have with your health care provider. Document Released: 08/29/2002 Document Revised: 05/18/2018 Document Reviewed: 05/18/2018 Elsevier Interactive Patient Education  2019 Elsevier Inc.  

## 2018-12-26 NOTE — Progress Notes (Signed)
[facility] [address] [place], [place] [id number]  Internal MEDICINE  Office Visit Note  Patient Name: Adrian ChristineMaurice Eugene Flores  [id number]  [id number]  Date of Service: 12/26/2018   Complaints/HPI Pt is here for establishment of PCP. Chief Complaint  Patient presents with  . New Patient (Initial Visit)   HPI Pt is here to establish care.  Pt is a well appearing 53 yo male.  He is currently not working.  He has a history of bradycardia, for which he takes flecainide and wears a CPAP device.  He states he was never diagnosed with sleep apnea, he wears the CPAP to help his heart rate at night.  He denies any other issues or complaints.  He is a former cigarette smoker.  He quit smoking over 25 years ago.  He is not drink any alcohol or use any illicit drug.  Current Medication: Outpatient Encounter Medications as of 12/26/2018  Medication Sig  . flecainide (TAMBOCOR) 50 MG tablet Take 50 mg by mouth 2 (two) times daily.  . nitroGLYCERIN (NITROSTAT) 0.3 MG SL tablet Place 0.3 mg under the tongue every 5 (five) minutes as needed for chest pain.  . pantoprazole (PROTONIX) 20 MG tablet Take 20 mg by mouth 2 (two) times daily.  . [DISCONTINUED] cyclobenzaprine (FLEXERIL) 10 MG tablet Take 1 tablet (10 mg total) by mouth 3 (three) times daily as needed.  . [DISCONTINUED] nitroGLYCERIN (NITROSTAT) 0.4 MG SL tablet Place 0.4 mg under the tongue every 5 (five) minutes as needed for chest pain.  Marland Kitchen. FLUoxetine (PROZAC) 20 MG capsule Take 20 mg by mouth daily.  . isosorbide mononitrate (IMDUR) 30 MG 24 hr tablet Take 1 tablet (30 mg total) by mouth daily.  . [DISCONTINUED] oxyCODONE-acetaminophen (ROXICET) 5-325 MG tablet Take 1 tablet by mouth every 6 (six) hours as needed for moderate pain. (Patient not taking: Reported on 12/26/2018)   No facility-administered encounter medications on file as of 12/26/2018.     Surgical History: History reviewed. No pertinent surgical history.  Medical  History: Past Medical History:  Diagnosis Date  . GERD (gastroesophageal reflux disease)     Family History: Family History  Problem Relation Age of Onset  . Heart disease Mother     Social History   Socioeconomic History  . Marital status: Married    Spouse name: Not on file  . Number of children: Not on file  . Years of education: Not on file  . Highest education level: Not on file  Occupational History  . Not on file  Social Needs  . Financial resource strain: Not on file  . Food insecurity:    Worry: Not on file    Inability: Not on file  . Transportation needs:    Medical: Not on file    Non-medical: Not on file  Tobacco Use  . Smoking status: Former Games developermoker  . Smokeless tobacco: Never Used  Substance and Sexual Activity  . Alcohol use: No  . Drug use: Not on file  . Sexual activity: Never  Lifestyle  . Physical activity:    Days per week: Not on file    Minutes per session: Not on file  . Stress: Not on file  Relationships  . Social connections:    Talks on phone: Not on file    Gets together: Not on file    Attends religious service: Not on file    Active member of club or organization: Not on file    Attends meetings  of clubs or organizations: Not on file    Relationship status: Not on file  . Intimate partner violence:    Fear of current or ex partner: Not on file    Emotionally abused: Not on file    Physically abused: Not on file    Forced sexual activity: Not on file  Other Topics Concern  . Not on file  Social History Narrative  . Not on file     Review of Systems  Constitutional: Negative.  Negative for chills, fatigue and unexpected weight change.  HENT: Negative.  Negative for congestion, rhinorrhea, sneezing and sore throat.   Eyes: Negative for redness.  Respiratory: Negative.  Negative for cough, chest tightness and shortness of breath.   Cardiovascular: Negative.  Negative for chest pain and palpitations.  Gastrointestinal:  Negative.  Negative for abdominal pain, constipation, diarrhea, nausea and vomiting.  Endocrine: Negative.   Genitourinary: Negative.  Negative for dysuria and frequency.  Musculoskeletal: Negative.  Negative for arthralgias, back pain, joint swelling and neck pain.  Skin: Negative.  Negative for rash.  Allergic/Immunologic: Negative.   Neurological: Negative.  Negative for tremors and numbness.  Hematological: Negative for adenopathy. Does not bruise/bleed easily.  Psychiatric/Behavioral: Negative.  Negative for behavioral problems, sleep disturbance and suicidal ideas. The patient is not nervous/anxious.     Vital Signs: BP 103/65   Pulse 64   Resp 16   Ht 5\' 9"  (1.753 m)   Wt 201 lb (91.2 kg)   SpO2 95%   BMI 29.68 kg/m    Physical Exam Vitals signs and nursing note reviewed.  Constitutional:      General: He is not in acute distress.    Appearance: He is well-developed. He is not diaphoretic.  HENT:     Head: Normocephalic and atraumatic.     Mouth/Throat:     Pharynx: No oropharyngeal exudate.  Eyes:     Pupils: Pupils are equal, round, and reactive to light.  Neck:     Musculoskeletal: Normal range of motion and neck supple.     Thyroid: No thyromegaly.     Vascular: No JVD.     Trachea: No tracheal deviation.  Cardiovascular:     Rate and Rhythm: Normal rate and regular rhythm.     Heart sounds: Normal heart sounds. No murmur. No friction rub. No gallop.   Pulmonary:     Effort: Pulmonary effort is normal. No respiratory distress.     Breath sounds: Normal breath sounds. No wheezing or rales.  Chest:     Chest wall: No tenderness.  Abdominal:     Palpations: Abdomen is soft.     Tenderness: There is no abdominal tenderness. There is no guarding.  Musculoskeletal: Normal range of motion.  Lymphadenopathy:     Cervical: No cervical adenopathy.  Skin:    General: Skin is warm and dry.  Neurological:     Mental Status: He is alert and oriented to person,  place, and time.     Cranial Nerves: No cranial nerve deficit.  Psychiatric:        Behavior: Behavior normal.        Thought Content: Thought content normal.        Judgment: Judgment normal.    Assessment/Plan: 1. CPAP (continuous positive airway pressure) dependence Continue using CPAP as prescribed.  Patient will continue to clean CPAP machine and change filters and mask seal.  He denies any issues with his current machine.  He reports that his bradycardia  is much improved with the use of CPAP.  2. Bradycardia Patient has bradycardia and takes flecainide  3. Former cigarette smoker Patient has been smoke-free for over 20 years.  General Counseling: corbett spittle understanding of the findings of todays visit and agrees with plan of treatment. I have discussed any further diagnostic evaluation that may be needed or ordered today. We also reviewed his medications today. he has been encouraged to call the office with any questions or concerns that should arise related to todays visit.  No orders of the defined types were placed in this encounter.   No orders of the defined types were placed in this encounter.   Time spent: 35 Minutes   This patient was seen by Blima Ledger AGNP-C in Collaboration with Dr Lyndon Code as a part of collaborative care agreement  Johnna Acosta AGNP-C Internal Medicine

## 2019-02-04 ENCOUNTER — Other Ambulatory Visit: Payer: Self-pay

## 2019-02-04 ENCOUNTER — Emergency Department
Admission: EM | Admit: 2019-02-04 | Discharge: 2019-02-04 | Disposition: A | Discharge status: Home / Self Care | Payer: Medicaid Other | Attending: Emergency Medicine | Admitting: Emergency Medicine

## 2019-02-04 ENCOUNTER — Emergency Department: Payer: Medicaid Other

## 2019-02-04 ENCOUNTER — Encounter: Payer: Self-pay | Admitting: Emergency Medicine

## 2019-02-04 DIAGNOSIS — Z79899 Other long term (current) drug therapy: Secondary | ICD-10-CM | POA: Insufficient documentation

## 2019-02-04 DIAGNOSIS — R0602 Shortness of breath: Secondary | ICD-10-CM | POA: Diagnosis present

## 2019-02-04 DIAGNOSIS — Z87891 Personal history of nicotine dependence: Secondary | ICD-10-CM | POA: Diagnosis not present

## 2019-02-04 HISTORY — DX: Bradycardia, unspecified: R00.1

## 2019-02-04 LAB — COMPREHENSIVE METABOLIC PANEL
ALT: 25 U/L (ref 0–44)
AST: 31 U/L (ref 15–41)
Albumin: 4.2 g/dL (ref 3.5–5.0)
Alkaline Phosphatase: 75 U/L (ref 38–126)
Anion gap: 8 (ref 5–15)
BUN: 13 mg/dL (ref 6–20)
CHLORIDE: 104 mmol/L (ref 98–111)
CO2: 28 mmol/L (ref 22–32)
Calcium: 8.9 mg/dL (ref 8.9–10.3)
Creatinine, Ser: 1.22 mg/dL (ref 0.61–1.24)
GFR calc Af Amer: >60 mL/min (ref >60)
GFR calc non Af Amer: >60 mL/min (ref >60)
Glucose, Bld: 102 mg/dL — ABNORMAL HIGH (ref 70–99)
Potassium: 3.7 mmol/L (ref 3.5–5.1)
Sodium: 140 mmol/L (ref 135–145)
Total Bilirubin: 1.5 mg/dL — ABNORMAL HIGH (ref 0.3–1.2)
Total Protein: 7.6 g/dL (ref 6.5–8.1)

## 2019-02-04 LAB — CBC
HCT: 42.2 % (ref 39.0–52.0)
Hemoglobin: 13.7 g/dL (ref 13.0–17.0)
MCH: 27.1 pg (ref 26.0–34.0)
MCHC: 32.5 g/dL (ref 30.0–36.0)
MCV: 83.4 fL (ref 80.0–100.0)
Platelets: 290 10*3/uL (ref 150–400)
RBC: 5.06 MIL/uL (ref 4.22–5.81)
RDW: 13.8 % (ref 11.5–15.5)
WBC: 5.6 10*3/uL (ref 4.0–10.5)
nRBC: 0 % (ref 0.0–0.2)

## 2019-02-04 LAB — TROPONIN I: Troponin I: <0.03 ng/mL (ref <0.03)

## 2019-02-04 MED ORDER — ALBUTEROL SULFATE HFA 108 (90 BASE) MCG/ACT IN AERS: 2.0000 | INHALATION_SPRAY | Freq: Four times a day (QID) | RESPIRATORY_TRACT | 0 refills | Status: DC | PRN

## 2019-02-04 MED ORDER — IPRATROPIUM-ALBUTEROL 0.5-2.5 (3) MG/3ML IN SOLN
3.0000 mL | Freq: Once | RESPIRATORY_TRACT | Status: AC
  Administered 2019-02-04: 3 mL via RESPIRATORY_TRACT
  Filled 2019-02-04: qty 3

## 2019-02-04 MED ORDER — SODIUM CHLORIDE 0.9% FLUSH: 3.0000 mL | Freq: Once | INTRAVENOUS | Status: DC

## 2019-02-04 NOTE — ED Triage Notes (Signed)
States noticed heartrate slow, 47, this am. Denies dizziness. States has had slight SOB today.

## 2019-02-04 NOTE — ED Notes (Signed)
IV removed by this nurse from pt's left AC, site clean and dry, bandage placed, catheter intact.

## 2019-02-04 NOTE — ED Provider Notes (Signed)
[facility] Emergency Department Provider Note  ____________________________________________   I have reviewed the triage vital signs and the nursing notes.   HISTORY  Chief Complaint My body does not feel right  History limited by: Not Limited   HPI Adrian Flores is a 53 y.o. male who presents to the emergency department today because he states that his body does not feel right.  He is having a hard time explaining exactly what he means.  Sounds like he has had some shortness of breath yesterday and today. Additionally had some occasional left-sided sharp chest pains.  Occasional dreams at the time my exam the patient states that the chest pain had resolved but he continues to feel slightly short of breath.  He denies any fevers.  Denies any known sick contacts.   Per medical record review patient has a history of bradycardia, GERD  Past Medical History:  Diagnosis Date  . Bradycardia   . GERD (gastroesophageal reflux disease)     There are no active problems to display for this patient.   History reviewed. No pertinent surgical history.  Prior to Admission medications   Medication Sig Start Date End Date Taking? Authorizing Provider  flecainide (TAMBOCOR) 50 MG tablet Take 50 mg by mouth 2 (two) times daily.    [provider]  FLUoxetine (PROZAC) 20 MG capsule Take 20 mg by mouth daily.    [provider]  isosorbide mononitrate (IMDUR) 30 MG 24 hr tablet Take 1 tablet (30 mg total) by mouth daily. 12/12/17 12/12/18  Arnaldo Natal, MD  nitroGLYCERIN (NITROSTAT) 0.3 MG SL tablet Place 0.3 mg under the tongue every 5 (five) minutes as needed for chest pain.    [provider]  pantoprazole (PROTONIX) 20 MG tablet Take 20 mg by mouth 2 (two) times daily.    [provider]    Allergies Patient has no known allergies.  Family History  Problem Relation Age of Onset  . Heart disease Mother     Social  History Social History   Tobacco Use  . Smoking status: Former Games developer  . Smokeless tobacco: Never Used  Substance Use Topics  . Alcohol use: No  . Drug use: Not on file    Review of Systems Constitutional: No fever/chills Eyes: No visual changes. ENT: No sore throat. Cardiovascular: Positive for left chest pain.  Respiratory: Positive for shortness of breath. Gastrointestinal: No abdominal pain.  No nausea, no vomiting.  No diarrhea.   Genitourinary: Negative for dysuria. Musculoskeletal: Negative for back pain. Skin: Negative for rash. Neurological: Negative for headaches, focal weakness or numbness.  ____________________________________________   PHYSICAL EXAM:  VITAL SIGNS: ED Triage Vitals  Enc Vitals Group     BP 02/04/19 1446 119/68     Pulse Rate 02/04/19 1446 (!) 58     Resp 02/04/19 1446 20     Temp 02/04/19 1446 97.8 F (36.6 C)     Temp Source 02/04/19 1446 Oral     SpO2 02/04/19 1446 100 %     Weight 02/04/19 1442 203 lb (92.1 kg)     Height 02/04/19 1442 5\' 9"  (1.753 m)     Head Circumference --      Peak Flow --      Pain Score 02/04/19 1442 0   Constitutional: Alert and oriented.  Eyes: Conjunctivae are normal.  ENT      Head: Normocephalic and atraumatic.      Nose: No congestion/rhinnorhea.  Mouth/Throat: Mucous membranes are moist.      Neck: No stridor. Hematological/Lymphatic/Immunilogical: No cervical lymphadenopathy. Cardiovascular: Normal rate, regular rhythm.  No murmurs, rubs, or gallops. Respiratory: Normal respiratory effort without tachypnea nor retractions. Breath sounds are clear and equal bilaterally. No wheezes/rales/rhonchi. Gastrointestinal: Soft and non tender. No rebound. No guarding.  Genitourinary: Deferred Musculoskeletal: Normal range of motion in all extremities. No lower extremity edema. Neurologic:  Normal speech and language. No gross focal neurologic deficits are appreciated.  Skin:  Skin is warm, dry and  intact. No rash noted. Psychiatric: Mood and affect are normal. Speech and behavior are normal. Patient exhibits appropriate insight and judgment.  ____________________________________________    LABS (pertinent positives/negatives)  Trop <0.03 CBC wbc 5.6, hgb 13.7, plt 290 CMP wnl except glu 102, t bili 1.5  ____________________________________________   EKG  I, Phineas Semen, attending physician, personally viewed and interpreted this EKG  EKG Time: 1445 Rate: 53 Rhythm: sinus bradycardia Axis: normal Intervals: qtc 416 QRS: narrow ST changes: no st elevation Impression: bradycardia otherwise normal ekg  ____________________________________________    RADIOLOGY  CXR No acute abnormality  ____________________________________________   PROCEDURES  Procedures  ____________________________________________   INITIAL IMPRESSION / ASSESSMENT AND PLAN / ED COURSE  Pertinent labs & imaging results that were available during my care of the patient were reviewed by me and considered in my medical decision making (see chart for details).   Patient presented to the emergency department because he has been feeling bad for the past 2 days.  Does have a somewhat hard time describing a spider although states he did have some shortness of breath.  Chest x-ray without any concerning findings for pneumonia or pneumothorax.  Patient did feel somewhat better after DuoNeb treatment.  At this point I think upper respiratory infection likely. Will plan on discharging with prescription for albuterol inhaler.    ____________________________________________   FINAL CLINICAL IMPRESSION(S) / ED DIAGNOSES  Final diagnoses:  SOB (shortness of breath)     Note: This dictation was prepared with Dragon dictation. Any transcriptional errors that result from this process are unintentional     Phineas Semen, MD 02/04/19 1814

## 2019-02-04 NOTE — Discharge Instructions (Addendum)
Please seek medical attention for any high fevers, chest pain, shortness of breath, change in behavior, persistent vomiting, bloody stool or any other new or concerning symptoms.  

## 2019-03-01 DIAGNOSIS — J449 Chronic obstructive pulmonary disease, unspecified: Secondary | ICD-10-CM | POA: Insufficient documentation

## 2019-03-01 DIAGNOSIS — I495 Sick sinus syndrome: Secondary | ICD-10-CM | POA: Insufficient documentation

## 2019-03-01 DIAGNOSIS — I4891 Unspecified atrial fibrillation: Secondary | ICD-10-CM | POA: Insufficient documentation

## 2019-03-01 DIAGNOSIS — I10 Essential (primary) hypertension: Secondary | ICD-10-CM | POA: Insufficient documentation

## 2019-03-14 ENCOUNTER — Encounter
Admission: RE | Admit: 2019-03-14 | Discharge: 2019-03-14 | Disposition: A | Discharge status: Home / Self Care | Payer: Medicaid Other | Source: Ambulatory Visit | Attending: Cardiology | Admitting: Cardiology

## 2019-03-14 ENCOUNTER — Other Ambulatory Visit: Payer: Self-pay

## 2019-03-14 ENCOUNTER — Ambulatory Visit
Admission: RE | Admit: 2019-03-14 | Discharge: 2019-03-14 | Disposition: A | Discharge status: Home / Self Care | Payer: Medicaid Other | Source: Ambulatory Visit | Attending: Cardiology | Admitting: Cardiology

## 2019-03-14 DIAGNOSIS — Z01818 Encounter for other preprocedural examination: Secondary | ICD-10-CM

## 2019-03-14 HISTORY — DX: Essential (primary) hypertension: I10

## 2019-03-14 HISTORY — DX: Endocarditis, valve unspecified: I38

## 2019-03-14 HISTORY — DX: Sleep apnea, unspecified: G47.30

## 2019-03-14 HISTORY — DX: Unspecified atrial fibrillation: I48.91

## 2019-03-14 HISTORY — DX: Chronic obstructive pulmonary disease, unspecified: J44.9

## 2019-03-14 LAB — CBC
HEMATOCRIT: 45.4 % (ref 39.0–52.0)
Hemoglobin: 14.4 g/dL (ref 13.0–17.0)
MCH: 27.3 pg (ref 26.0–34.0)
MCHC: 31.7 g/dL (ref 30.0–36.0)
MCV: 86 fL (ref 80.0–100.0)
Platelets: 317 10*3/uL (ref 150–400)
RBC: 5.28 MIL/uL (ref 4.22–5.81)
RDW: 14 % (ref 11.5–15.5)
WBC: 5.8 10*3/uL (ref 4.0–10.5)
nRBC: 0 % (ref 0.0–0.2)

## 2019-03-14 LAB — BASIC METABOLIC PANEL
Anion gap: 8 (ref 5–15)
BUN: 17 mg/dL (ref 6–20)
CO2: 30 mmol/L (ref 22–32)
Calcium: 9.3 mg/dL (ref 8.9–10.3)
Chloride: 100 mmol/L (ref 98–111)
Creatinine, Ser: 1.34 mg/dL — ABNORMAL HIGH (ref 0.61–1.24)
GFR calc Af Amer: >60 mL/min (ref >60)
GFR calc non Af Amer: >60 mL/min (ref >60)
Glucose, Bld: 114 mg/dL — ABNORMAL HIGH (ref 70–99)
POTASSIUM: 4.3 mmol/L (ref 3.5–5.1)
Sodium: 138 mmol/L (ref 135–145)

## 2019-03-14 LAB — SURGICAL PCR SCREEN
MRSA, PCR: NEGATIVE
Staphylococcus aureus: NEGATIVE

## 2019-03-14 LAB — APTT: aPTT: 31 seconds (ref 24–36)

## 2019-03-14 LAB — PROTIME-INR
INR: 0.9 (ref 0.8–1.2)
Prothrombin Time: 12.3 seconds (ref 11.4–15.2)

## 2019-03-14 NOTE — Patient Instructions (Signed)
Your procedure is scheduled on: Thursday 03/23/19 Report to DAY SURGERY DEPARTMENT LOCATED ON 2ND FLOOR MEDICAL MALL ENTRANCE. To find out your arrival time please call [phone] between 1PM - 3PM on Wednesday 03/22/19.  Remember: Instructions that are not followed completely may result in serious medical risk, up to and including death, or upon the discretion of your surgeon and anesthesiologist your surgery may need to be rescheduled.     _X__ 1. Do not eat food after midnight the night before your procedure.                 No gum chewing or hard candies. You may drink clear liquids up to 2 hours                 before you are scheduled to arrive for your surgery- DO not drink clear                 liquids within 2 hours of the start of your surgery.                 Clear Liquids include:  water, apple juice without pulp, clear carbohydrate                 drink such as Clearfast or Gatorade, Black Coffee or Tea (Do not add                 anything to coffee or tea).  __X__2.  On the morning of surgery brush your teeth with toothpaste and water, you                 may rinse your mouth with mouthwash if you wish.  Do not swallow any              toothpaste of mouthwash.     _X__ 3.  No Alcohol for 24 hours before or after surgery.   _X__ 4.  Do Not Smoke or use e-cigarettes For 24 Hours Prior to Your Surgery.                 Do not use any chewable tobacco products for at least 6 hours prior to                 surgery.  ____  5.  Bring all medications with you on the day of surgery if instructed.   __X__  6.  Notify your doctor if there is any change in your medical condition      (cold, fever, infections).     Do not wear jewelry, make-up, hairpins, clips or nail polish. Do not wear lotions, powders, or perfumes.  Do not shave 48 hours prior to surgery. Men may shave face and neck. Do not bring valuables to the hospital.    [facility] is not responsible for any belongings or  valuables.  Contacts, dentures/partials or body piercings may not be worn into surgery. Bring a case for your contacts, glasses or hearing aids, a denture cup will be supplied. Leave your suitcase in the car. After surgery it may be brought to your room. For patients admitted to the hospital, discharge time is determined by your treatment team.   Patients discharged the day of surgery will not be allowed to drive home.   Please read over the following fact sheets that you were given:   MRSA Information  __X__ Take these medicines the morning of surgery with A SIP OF WATER:  1. flecainide (TAMBOCOR)   2. pantoprazole (PROTONIX)   3.   4.  5.  6.  ____ Fleet Enema (as directed)   __X__ Use CHG Soap/SAGE wipes as directed  ____ Use inhalers on the day of surgery  ____ Stop metformin/Janumet/Farxiga 2 days prior to surgery    ____ Take 1/2 of usual insulin dose the night before surgery. No insulin the morning          of surgery.   ____ Stop Blood Thinners Coumadin/Plavix/Xarelto/Pleta/Pradaxa/Eliquis/Effient/Aspirin  on   Or contact your Surgeon, Cardiologist or Medical Doctor regarding  ability to stop your blood thinners  __X__ Stop Anti-inflammatories 7 days before surgery such as Advil, Ibuprofen, Motrin,  BC or Goodies Powder, Naprosyn, Naproxen, Aleve, Aspirin    __X__ Stop all herbal supplements, fish oil or vitamin E until after surgery.    __X__ Bring C-Pap to the hospital.

## 2019-03-22 ENCOUNTER — Encounter: Payer: Self-pay | Admitting: Anesthesiology

## 2019-03-22 MED ORDER — CEFAZOLIN SODIUM-DEXTROSE 1-4 GM/50ML-% IV SOLN
1.0000 g | Freq: Once | INTRAVENOUS | Status: AC
  Administered 2019-03-23: 1 g via INTRAVENOUS

## 2019-03-23 ENCOUNTER — Ambulatory Visit: Payer: Medicaid Other

## 2019-03-23 ENCOUNTER — Encounter: Payer: Self-pay | Admitting: *Deleted

## 2019-03-23 ENCOUNTER — Encounter
Admission: RE | Disposition: A | Discharge status: Home / Self Care | Payer: Self-pay | Source: Home / Self Care | Attending: Cardiology

## 2019-03-23 ENCOUNTER — Ambulatory Visit: Payer: Medicaid Other | Admitting: Anesthesiology

## 2019-03-23 ENCOUNTER — Observation Stay
Admission: RE | Admit: 2019-03-23 | Discharge: 2019-03-24 | Disposition: A | Discharge status: Home / Self Care | Payer: Medicaid Other | Attending: Cardiology | Admitting: Cardiology

## 2019-03-23 ENCOUNTER — Other Ambulatory Visit: Payer: Self-pay

## 2019-03-23 ENCOUNTER — Observation Stay: Payer: Medicaid Other

## 2019-03-23 DIAGNOSIS — Z87891 Personal history of nicotine dependence: Secondary | ICD-10-CM | POA: Insufficient documentation

## 2019-03-23 DIAGNOSIS — K219 Gastro-esophageal reflux disease without esophagitis: Secondary | ICD-10-CM | POA: Diagnosis not present

## 2019-03-23 DIAGNOSIS — Z79899 Other long term (current) drug therapy: Secondary | ICD-10-CM | POA: Insufficient documentation

## 2019-03-23 DIAGNOSIS — E785 Hyperlipidemia, unspecified: Secondary | ICD-10-CM | POA: Diagnosis not present

## 2019-03-23 DIAGNOSIS — J939 Pneumothorax, unspecified: Secondary | ICD-10-CM

## 2019-03-23 DIAGNOSIS — I495 Sick sinus syndrome: Secondary | ICD-10-CM | POA: Diagnosis not present

## 2019-03-23 DIAGNOSIS — I480 Paroxysmal atrial fibrillation: Secondary | ICD-10-CM | POA: Insufficient documentation

## 2019-03-23 DIAGNOSIS — G4733 Obstructive sleep apnea (adult) (pediatric): Secondary | ICD-10-CM | POA: Insufficient documentation

## 2019-03-23 DIAGNOSIS — J449 Chronic obstructive pulmonary disease, unspecified: Secondary | ICD-10-CM | POA: Diagnosis not present

## 2019-03-23 DIAGNOSIS — I10 Essential (primary) hypertension: Secondary | ICD-10-CM | POA: Insufficient documentation

## 2019-03-23 DIAGNOSIS — E119 Type 2 diabetes mellitus without complications: Secondary | ICD-10-CM | POA: Insufficient documentation

## 2019-03-23 HISTORY — PX: PACEMAKER INSERTION: SHX728

## 2019-03-23 SURGERY — INSERTION, CARDIAC PACEMAKER
Anesthesia: Monitor Anesthesia Care | Site: Chest | Laterality: Left

## 2019-03-23 MED ORDER — GLYCOPYRROLATE 0.2 MG/ML IJ SOLN
INTRAMUSCULAR | Status: AC
  Filled 2019-03-23: qty 1

## 2019-03-23 MED ORDER — CEFAZOLIN SODIUM-DEXTROSE 1-4 GM/50ML-% IV SOLN
1.0000 g | Freq: Four times a day (QID) | INTRAVENOUS | Status: AC
  Administered 2019-03-23 – 2019-03-24 (×3): 1 g via INTRAVENOUS
  Filled 2019-03-23 (×3): qty 50

## 2019-03-23 MED ORDER — SODIUM CHLORIDE 0.9 % IV SOLN
Freq: Once | INTRAVENOUS | Status: DC
  Filled 2019-03-23: qty 2

## 2019-03-23 MED ORDER — LIDOCAINE HCL (CARDIAC) PF 100 MG/5ML IV SOSY
PREFILLED_SYRINGE | INTRAVENOUS | Status: DC | PRN
  Administered 2019-03-23: 100 mg via INTRAVENOUS

## 2019-03-23 MED ORDER — FENTANYL CITRATE (PF) 100 MCG/2ML IJ SOLN
INTRAMUSCULAR | Status: DC | PRN
  Administered 2019-03-23 (×2): 50 ug via INTRAVENOUS

## 2019-03-23 MED ORDER — PROPOFOL 500 MG/50ML IV EMUL
INTRAVENOUS | Status: DC | PRN
  Administered 2019-03-23: 50 ug/kg/min via INTRAVENOUS

## 2019-03-23 MED ORDER — PROPOFOL 10 MG/ML IV BOLUS
INTRAVENOUS | Status: DC | PRN
  Administered 2019-03-23: 10 mg via INTRAVENOUS
  Administered 2019-03-23 (×2): 20 mg via INTRAVENOUS

## 2019-03-23 MED ORDER — MIDAZOLAM HCL 2 MG/2ML IJ SOLN
INTRAMUSCULAR | Status: DC | PRN
  Administered 2019-03-23: 2 mg via INTRAVENOUS

## 2019-03-23 MED ORDER — PROPOFOL 10 MG/ML IV BOLUS
INTRAVENOUS | Status: AC
  Filled 2019-03-23: qty 20

## 2019-03-23 MED ORDER — IOPAMIDOL (ISOVUE-300) INJECTION 61%
INTRAVENOUS | Status: DC | PRN
  Administered 2019-03-23: 20 mL via INTRAVENOUS

## 2019-03-23 MED ORDER — FENTANYL CITRATE (PF) 100 MCG/2ML IJ SOLN
INTRAMUSCULAR | Status: AC
  Filled 2019-03-23: qty 2

## 2019-03-23 MED ORDER — MIDAZOLAM HCL 2 MG/2ML IJ SOLN
INTRAMUSCULAR | Status: AC
  Filled 2019-03-23: qty 2

## 2019-03-23 MED ORDER — ONDANSETRON HCL 4 MG/2ML IJ SOLN: 4.0000 mg | Freq: Four times a day (QID) | INTRAMUSCULAR | Status: DC | PRN

## 2019-03-23 MED ORDER — LIDOCAINE 1 % OPTIME INJ - NO CHARGE
INTRAMUSCULAR | Status: DC | PRN
  Administered 2019-03-23: 13:00:00 25 mL

## 2019-03-23 MED ORDER — LACTATED RINGERS IV SOLN
INTRAVENOUS | Status: DC
  Administered 2019-03-23: 12:00:00 via INTRAVENOUS

## 2019-03-23 MED ORDER — ACETAMINOPHEN 325 MG PO TABS
325.0000 mg | ORAL_TABLET | ORAL | Status: DC | PRN
  Administered 2019-03-23 – 2019-03-24 (×3): 650 mg via ORAL
  Filled 2019-03-23 (×3): qty 2

## 2019-03-23 MED ORDER — ONDANSETRON HCL 4 MG/2ML IJ SOLN: 4.0000 mg | Freq: Once | INTRAMUSCULAR | Status: DC | PRN

## 2019-03-23 MED ORDER — FENTANYL CITRATE (PF) 100 MCG/2ML IJ SOLN
INTRAMUSCULAR | Status: AC
  Administered 2019-03-23: 14:00:00 25 ug via INTRAVENOUS
  Filled 2019-03-23: qty 2

## 2019-03-23 MED ORDER — FLECAINIDE ACETATE 50 MG PO TABS
50.0000 mg | ORAL_TABLET | Freq: Two times a day (BID) | ORAL | Status: DC
  Administered 2019-03-23 – 2019-03-24 (×2): 50 mg via ORAL
  Filled 2019-03-23 (×4): qty 1

## 2019-03-23 MED ORDER — CEFAZOLIN SODIUM-DEXTROSE 1-4 GM/50ML-% IV SOLN
INTRAVENOUS | Status: AC
  Filled 2019-03-23: qty 50

## 2019-03-23 MED ORDER — ALBUTEROL SULFATE HFA 108 (90 BASE) MCG/ACT IN AERS
2.0000 | INHALATION_SPRAY | Freq: Four times a day (QID) | RESPIRATORY_TRACT | Status: DC | PRN
  Filled 2019-03-23: qty 6.7

## 2019-03-23 MED ORDER — LIDOCAINE HCL (PF) 2 % IJ SOLN
INTRAMUSCULAR | Status: AC
  Filled 2019-03-23: qty 10

## 2019-03-23 MED ORDER — FENTANYL CITRATE (PF) 100 MCG/2ML IJ SOLN
25.0000 ug | INTRAMUSCULAR | Status: DC | PRN
  Administered 2019-03-23 (×4): 25 ug via INTRAVENOUS

## 2019-03-23 MED ORDER — SODIUM CHLORIDE (PF) 0.9 % IJ SOLN
INTRAMUSCULAR | Status: AC
  Filled 2019-03-23: qty 50

## 2019-03-23 MED ORDER — SODIUM CHLORIDE 0.9 % IV SOLN
INTRAVENOUS | Status: DC | PRN
  Administered 2019-03-23: 250 mL

## 2019-03-23 MED ORDER — ISOSORBIDE MONONITRATE ER 30 MG PO TB24
30.0000 mg | ORAL_TABLET | Freq: Every day | ORAL | Status: DC
  Administered 2019-03-23: 30 mg via ORAL
  Filled 2019-03-23 (×2): qty 1

## 2019-03-23 MED ORDER — PANTOPRAZOLE SODIUM 20 MG PO TBEC
20.0000 mg | DELAYED_RELEASE_TABLET | Freq: Every day | ORAL | Status: DC
  Administered 2019-03-23 – 2019-03-24 (×2): 20 mg via ORAL
  Filled 2019-03-23 (×2): qty 1

## 2019-03-23 SURGICAL SUPPLY — 40 items
BAG DECANTER FOR FLEXI CONT (MISCELLANEOUS) ×3 IMPLANT
BRUSH SCRUB EZ  4% CHG (MISCELLANEOUS) ×2
BRUSH SCRUB EZ 4% CHG (MISCELLANEOUS) ×1 IMPLANT
CABLE SURG 12 DISP A/V CHANNEL (MISCELLANEOUS) ×3 IMPLANT
CANISTER SUCT 1200ML W/VALVE (MISCELLANEOUS) ×3 IMPLANT
CHLORAPREP W/TINT 26 (MISCELLANEOUS) ×3 IMPLANT
COVER LIGHT HANDLE STERIS (MISCELLANEOUS) ×6 IMPLANT
COVER MAYO STAND STRL (DRAPES) ×3 IMPLANT
COVER WAND RF STERILE (DRAPES) IMPLANT
DRAPE C-ARM XRAY 36X54 (DRAPES) ×3 IMPLANT
DRSG TEGADERM 4X4.75 (GAUZE/BANDAGES/DRESSINGS) ×3 IMPLANT
DRSG TELFA 4X3 1S NADH ST (GAUZE/BANDAGES/DRESSINGS) ×3 IMPLANT
ELECT REM PT RETURN 9FT ADLT (ELECTROSURGICAL) ×3
ELECTRODE REM PT RTRN 9FT ADLT (ELECTROSURGICAL) ×1 IMPLANT
GLIDEWIRE STIFF .35X180X3 HYDR (WIRE) IMPLANT
GLOVE BIO SURGEON STRL SZ7.5 (GLOVE) ×3 IMPLANT
GLOVE BIO SURGEON STRL SZ8 (GLOVE) ×3 IMPLANT
GOWN STRL REUS W/ TWL LRG LVL3 (GOWN DISPOSABLE) ×1 IMPLANT
GOWN STRL REUS W/ TWL XL LVL3 (GOWN DISPOSABLE) ×1 IMPLANT
GOWN STRL REUS W/TWL LRG LVL3 (GOWN DISPOSABLE) ×2
GOWN STRL REUS W/TWL XL LVL3 (GOWN DISPOSABLE) ×2
IMMOBILIZER SHDR MD LX WHT (SOFTGOODS) IMPLANT
IMMOBILIZER SHDR XL LX WHT (SOFTGOODS) ×3 IMPLANT
INTRO PACEMAKR LEAD 9FR 13CM (INTRODUCER) ×6
INTRO PACEMKR SHEATH II 7FR (MISCELLANEOUS) ×3
INTRODUCER PACEMKR LD 9FR 13CM (INTRODUCER) ×2 IMPLANT
INTRODUCER PACEMKR SHTH II 7FR (MISCELLANEOUS) ×1 IMPLANT
IPG PACE AZUR XT DR MRI W1DR01 (Pacemaker) ×1 IMPLANT
IV NS 500ML (IV SOLUTION) ×2
IV NS 500ML BAXH (IV SOLUTION) ×1 IMPLANT
KIT TURNOVER KIT A (KITS) ×3 IMPLANT
LABEL OR SOLS (LABEL) ×3 IMPLANT
LEAD CAPSURE NOVUS 5076-52CM (Lead) ×3 IMPLANT
LEAD CAPSURE NOVUS 5076-58CM (Lead) ×3 IMPLANT
MARKER SKIN DUAL TIP RULER LAB (MISCELLANEOUS) ×3 IMPLANT
PACE AZURE XT DR MRI W1DR01 (Pacemaker) ×3 IMPLANT
PACK PACE INSERTION (MISCELLANEOUS) ×3 IMPLANT
PAD ONESTEP ZOLL R SERIES ADT (MISCELLANEOUS) ×3 IMPLANT
SUT SILK 0 SH 30 (SUTURE) ×9 IMPLANT
TOWEL OR 17X26 4PK STRL BLUE (TOWEL DISPOSABLE) ×3 IMPLANT

## 2019-03-23 NOTE — Transfer of Care (Signed)
Immediate Anesthesia Transfer of Care Note  Patient: Gray Doering Delahoussaye  Procedure(s) Performed: INSERTION PACEMAKER DUAL CHAMBER (Left Chest)  Patient Location: PACU  Anesthesia Type:MAC  Level of Consciousness: awake, alert  and oriented  Airway & Oxygen Therapy: Patient Spontanous Breathing  Post-op Assessment: Report given to RN and Post -op Vital signs reviewed and stable  Post vital signs: Reviewed and stable  Last Vitals:  Vitals Value Taken Time  BP    Temp    Pulse 59 03/23/2019  1:20 PM  Resp 15 03/23/2019  1:20 PM  SpO2 99 % 03/23/2019  1:20 PM  Vitals shown include unvalidated device data.  Last Pain:  Vitals:   03/23/19 1111  TempSrc: Tympanic  PainSc: 0-No pain         Complications: No apparent anesthesia complications

## 2019-03-23 NOTE — Interval H&P Note (Signed)
History and Physical Interval Note:  03/23/2019 11:19 AM  Adrian Flores  has presented today for surgery, with the diagnosis of SICK SINUS SYDROM / BRADYCARDIA.  The various methods of treatment have been discussed with the patient and family. After consideration of risks, benefits and other options for treatment, the patient has consented to  Procedure(s): INSERTION PACEMAKER DUAL CHAMBER (N/A) as a surgical intervention.  The patient's history has been reviewed, patient examined, no change in status, stable for surgery.  I have reviewed the patient's chart and labs.  Questions were answered to the patient's satisfaction.     Muscab Brenneman [employer]

## 2019-03-23 NOTE — Progress Notes (Signed)
Pt stated he dose,t need any help with cpap

## 2019-03-23 NOTE — Op Note (Signed)
[facility]   03/23/2019                     1:24 PM  PATIENT:  Essie Christine Sivils    PRE-OPERATIVE DIAGNOSIS:  SICK SINUS SYDROM / BRADYCARDIA  POST-OPERATIVE DIAGNOSIS:  Same  PROCEDURE:  INSERTION PACEMAKER DUAL CHAMBER  SURGEON:  Marcina Millard, MD    ANESTHESIA:     PREOPERATIVE INDICATIONS:  Dianna Brehm Wynder is a  53 y.o. male with a diagnosis of SICK SINUS SYDROM / BRADYCARDIA who failed conservative measures and elected for surgical management.    The risks benefits and alternatives were discussed with the patient preoperatively including but not limited to the risks of infection, bleeding, cardiopulmonary complications, the need for revision surgery, among others, and the patient was willing to proceed.   OPERATIVE PROCEDURE: The patient was brought to the operating room in a fasting state.  The left pectoral region was prepped and draped in usual sterile manner.  Anesthesia was obtained 1% lidocaine locally.  A 6 cm incision was performed to the left pectoral region.  The pacemaker pocket was generated by electrocautery and blunt dissection.  MRI compatible leads were positioned into the right ventricular apical septum ( Medtronic [id number] )  and right atrial appendage ( Medtronic [id number] ) under fluoroscopic guidance.  After proper thresholds were obtained the leads were sutured in place with 0 silk.  The leads were connected to an MRI compatible dual-chamber pacemaker generator ( Medtronic [id number] ).  Pacemaker pocket was irrigated gentamicin solution.  The pacemaker generator was positioned into the pocket and the pocket was closed with 2-0 and 4-0 Vicryl, respectively.  Steri-Strips and pressure dressing were applied.  Postprocedural interrogation revealed appropriate dual-chamber atrial and ventricular sensing and pacing thresholds.  There were no periprocedural complications.

## 2019-03-23 NOTE — Progress Notes (Signed)
[employer] called and made aware of of home CPAP needing to be checked.  No frayed wires noted at time of inspection.  RN aware and will put in order  for patient to be able to use home CPAP.

## 2019-03-23 NOTE — Anesthesia Preprocedure Evaluation (Signed)
Anesthesia Evaluation  Patient identified by MRN, date of birth, ID band Patient awake    Reviewed: Allergy & Precautions, NPO status , Patient's Chart, lab work & pertinent test results, reviewed documented beta blocker date and time   Airway Mallampati: II  TM Distance: >3 FB     Dental  (+) Chipped   Pulmonary sleep apnea , COPD, former smoker,           Cardiovascular hypertension, Pt. on medications + dysrhythmias Atrial Fibrillation      Neuro/Psych    GI/Hepatic GERD  Controlled,  Endo/Other    Renal/GU      Musculoskeletal   Abdominal   Peds  Hematology   Anesthesia Other Findings HR down. EKG ok except for brady.  Reproductive/Obstetrics                             Anesthesia Physical Anesthesia Plan  ASA: III  Anesthesia Plan: MAC   Post-op Pain Management:    Induction:   PONV Risk Score and Plan:   Airway Management Planned:   Additional Equipment:   Intra-op Plan:   Post-operative Plan:   Informed Consent: I have reviewed the patients History and Physical, chart, labs and discussed the procedure including the risks, benefits and alternatives for the proposed anesthesia with the patient or authorized representative who has indicated his/her understanding and acceptance.       Plan Discussed with: CRNA  Anesthesia Plan Comments:         Anesthesia Quick Evaluation

## 2019-03-23 NOTE — Anesthesia Post-op Follow-up Note (Signed)
Anesthesia QCDR form completed.        

## 2019-03-23 NOTE — H&P (Signed)
Jump to Section ? Document InformationECG ResultsEncounter DetailsHistorical MedicationsImaging ResultsLast Filed Vital SignsPatient ContactsPatient DemographicsPatient InstructionsPlan of TreatmentProceduresProgress NotesReason for VisitSocial HistoryVisit Diagnoses Adrian Flores, generated on Mar. 24, 2020March 24, 2020 Printout Information  Document Contents Document Received Date Document Source Organization  Initial consult Mar. 24, 2020March 24, 2020 [facility]   Patient Demographics - 53 y.o. Male; born 17-Apr-196706-24-1967  Patient Address Communication Language Race / Ethnicity Marital Status  [address] [place], [place] [address] [phone] [facility]) [phone] (Home) English (Preferred) White / Not Hispanic or Latino Married   Reason for Visit  Reason Comments  Establish Care per [facility] pacemaker placement  Chest Pain tightnees  Fatigue    Encounter Details  Date Type Department Care Team Description  03/01/2019 Initial consult [facility]  [address]  [place], [place] [address]  [phone]  Marcina Millard, MD  [address]  [facility]  [place], [place] [address]  [phone]  [phone] (Fax)  Paroxysmal atrial fibrillation (CMS-HCC) (Primary Dx);  Essential hypertension;  Chronic obstructive pulmonary disease, unspecified COPD type (CMS-HCC);  Sick sinus syndrome (CMS-HCC)   Social History - documented as of this encounter Tobacco Use Types Packs/Day Years Used Date  Former Smoker    Quit: 09/05/1990  Smokeless Tobacco: Never Used      Alcohol Use Drinks/Week oz/Week Comments  No 0 Standard drinks or equivalent  0.0    Sex Assigned at [employer] Date Recorded  Not on file    Job Start Date Occupation Industry  Not on file Not on file Not on file   Travel History Travel Start Travel End  No recent travel history available.     Last Filed  Vital Signs - documented in this encounter Vital Sign Reading Time Taken Comments  Blood Pressure 118/70 03/01/2019 2:04 PM EDT   Pulse 57 03/01/2019 2:04 PM EDT   Temperature - -   Respiratory Rate - -   Oxygen Saturation 98% 03/01/2019 2:04 PM EDT   Inhaled Oxygen Concentration - -   Weight 92.1 kg (203 lb) 03/01/2019 2:04 PM EDT   Height 175.3 cm ( ) 03/01/2019 2:04 PM EDT   Body Mass Index 29.98 03/01/2019 2:04 PM EDT    Patient Instructions - documented in this encounter Patient Instructions Marcina Millard, MD - 03/01/2019 2:00 PM EDT   Patient Education    DASH Diet: Care Instructions Your Care Instructions  The DASH diet is an eating plan that can help lower your blood pressure. DASH stands for Dietary Approaches to Stop Hypertension. Hypertension is high blood pressure. The DASH diet focuses on eating foods that are high in calcium, potassium, and magnesium. These nutrients can lower blood pressure. The foods that are highest in these nutrients are fruits, vegetables, low-fat dairy products, nuts, seeds, and legumes. But taking calcium, potassium, and magnesium supplements instead of eating foods that are high in those nutrients does not have the same effect. The DASH diet also includes whole grains, fish, and poultry. The DASH diet is one of several lifestyle changes your doctor may recommend to lower your high blood pressure. Your doctor may also want you to decrease the amount of sodium in your diet. Lowering sodium while following the DASH diet can lower blood pressure even further than just the DASH diet alone. Follow-up care is a key part of your treatment and safety. Be sure to make and go to all appointments, and call your doctor if you are  having problems. It's also a good idea to know your test results and keep a list of the medicines you take. How can you care for yourself at home? Following the DASH diet  Eat 4 to 5 servings of fruit each day. A  serving is 1 medium-sized piece of fruit,  cup chopped or canned fruit, 1/4 cup dried fruit, or 4 ounces ( cup) of fruit juice. Choose fruit more often than fruit juice.  Eat 4 to 5 servings of vegetables each day. A serving is 1 cup of lettuce or raw leafy vegetables,  cup of chopped or cooked vegetables, or 4 ounces ( cup) of vegetable juice. Choose vegetables more often than vegetable juice.  Get 2 to 3 servings of low-fat and fat-free dairy each day. A serving is 8 ounces of milk, 1 cup of yogurt, or 1  ounces of cheese.  Eat 6 to 8 servings of grains each day. A serving is 1 slice of bread, 1 ounce of dry cereal, or  cup of cooked rice, pasta, or cooked cereal. Try to choose whole-grain products as much as possible.  Limit lean meat, poultry, and fish to 2 servings each day. A serving is 3 ounces, about the size of a deck of cards.  Eat 4 to 5 servings of nuts, seeds, and legumes (cooked dried beans, lentils, and split peas) each week. A serving is 1/3 cup of nuts, 2 tablespoons of seeds, or  cup of cooked beans or peas.  Limit fats and oils to 2 to 3 servings each day. A serving is 1 teaspoon of vegetable oil or 2 tablespoons of salad dressing.  Limit sweets and added sugars to 5 servings or less a week. A serving is 1 tablespoon jelly or jam,  cup sorbet, or 1 cup of lemonade.  Eat less than 2,300 milligrams (mg) of sodium a day. If you limit your sodium to 1,500 mg a day, you can lower your blood pressure even more. Tips for success  Start small. Do not try to make dramatic changes to your diet all at once. You might feel that you are missing out on your favorite foods and then be more likely to not follow the plan. Make small changes, and stick with them. Once those changes become habit, add a few more changes.  Try some of the following: ? Make it a goal to eat a fruit or vegetable at every meal and at snacks. This will make it easy to get the recommended amount of fruits and  vegetables each day. ? Try yogurt topped with fruit and nuts for a snack or healthy dessert. ? Add lettuce, tomato, cucumber, and onion to sandwiches. ? Combine a ready-made pizza crust with low-fat mozzarella cheese and lots of vegetable toppings. Try using tomatoes, squash, spinach, broccoli, carrots, cauliflower, and onions. ? Have a variety of cut-up vegetables with a low-fat dip as an appetizer instead of chips and dip. ? Sprinkle sunflower seeds or chopped almonds over salads. Or try adding chopped walnuts or almonds to cooked vegetables. ? Try some vegetarian meals using beans and peas. Add garbanzo or kidney beans to salads. Make burritos and tacos with mashed pinto beans or black beans. Where can you learn more? Log in to your [facility] MyChart account at [email or web address] and click on top menu option "Health" then select "Search Medical Library". Enter [phone] in the search box and click the magnify glass to learn more about "DASH Diet: Care Instructions." Current as of: March 29, 2018 Content Version: 12.3  2006-2019 [employer]. Care instructions adapted under license by your healthcare professional. If you have questions about a medical condition or this instruction, always ask your healthcare professional. [employer] Inc[employer] disclaims any warranty or liability for your use of this information.      Electronically signed by Marcina Millard, MD at 03/01/2019 2:18 PM EDT     Progress Notes - documented in this encounter Marcina Millard, MD - 03/01/2019 2:00 PM EDT Formatting of this note might be different from the original. New Patient Visit   Chief Complaint: Chief Complaint  Patient presents with  . Establish Care  per [facility] pacemaker placement  . Chest Pain  tightnees  . Fatigue  Date of Service: 03/01/2019 Date of Birth: [birth year] [facility]CP: [facility]  History of Present Illness: Adrian Flores is a 53 y.o.male patient who is  referred for evaluation of permanent pacemaker for sick sinus syndrome. Patient has a history of paroxysmal atrial fibrillation, essential hypertension and hyperlipidemia. Also has a history of obstructive sleep apnea on CPAP. The patient denies chest pain or shortness of breath. He denies palpitations or heart racing. He denies peripheral edema. He does experience generalized weakness, fatigue and occasional dizziness. Holter monitor was performed which revealed predominant sinus rhythm with mean heart rate of 60 bpm, minimum heart rate of 37 bpm, with evidence of 3-second pauses. 2D echocardiogram 02/09/2019 revealed normal left ventricular function, with LVEF of 50%.  The patient has paroxysmal atrial fibrillation, on flecainide 50 mg twice daily for rhythm control. Patient has a chads vas score of 1, currently not on chronic anticoagulation.  The patient has essential hypertension, blood pressure in normal range on diet therapy. The patient follows a low-sodium, no added salt diet.  The patient has hyperlipidemia, on diet therapy. The patient follows a low-cholesterol, low-fat diet.  Past Medical and Surgical History  Past Medical History Past Medical History:  Diagnosis Date  . Atrial fibrillation (CMS-HCC)  . COPD (chronic obstructive pulmonary disease) (CMS-HCC)  . GERD (gastroesophageal reflux disease)  . Hypertension  . Leaky heart valve   Past Surgical History He has a past surgical history that includes Correction Lid Retraction W/Implantation Upper Eyelid Load.   Medications and Allergies  Current Medications Current Outpatient Medications  Medication Sig Dispense Refill  . pantoprazole (PROTONIX) 20 MG DR tablet Take 20 mg by mouth once daily  . flecainide (TAMBOCOR) 50 MG tablet Take 50 mg by mouth every 12 (twelve) hours  . nitroGLYcerin (NITROSTAT) 0.3 MG SL tablet TAKE 1 TABLET UNDER TONGUE EVERY 5 MIN FOR UP TO 3 DOSES FOR CHEST PAIN AS NEEDED.  Marland Kitchen oxyCODONE-acetaminophen  (PERCOCET) 5-325 mg tablet Take 1 tablet by mouth every 6 (six) hours as needed for Pain. 20 tablet 0  . predniSONE (DELTASONE) 10 mg tablet pack 6 day taper take as directed 21 tablet 0  . tamsulosin (FLOMAX) 0.4 mg capsule TAKE 1 CAPSULE BY MOUTH EVERY DAY IN THE MORNING   No current facility-administered medications for this visit.   Allergies Patient has no known allergies.  Social and Family History  Social History reports that he quit smoking about 28 years ago. He has never used smokeless tobacco. He reports that he does not drink alcohol or use drugs.  Family History family history includes Asthma in his mother; COPD in his mother; Lung cancer in his father.   Review of Systems   Review of Systems: The patient denies chest pain, shortness of breath, orthopnea, paroxysmal  nocturnal dyspnea, pedal edema, palpitations, heart racing, presyncope, syncope. Review of 12 Systems is negative except as described above.  Physical Examination   Vitals:BP 118/70  Pulse 57  Ht 175.3 cm (5\' 9" )  Wt 92.1 kg (203 lb)  SpO2 98%  BMI 29.98 kg/m  Ht:175.3 cm (5\' 9" ) Wt:92.1 kg (203 lb) TUU:EKCM surface area is 2.12 meters squared. Body mass index is 29.98 kg/m.  HEENT: Pupils equally reactive to light and accomodation  Neck: Supple without thyromegaly, carotid pulses 2+ Lungs: clear to auscultation bilaterally; no wheezes, rales, rhonchi Heart: Regular rate and rhythm. No gallops, murmurs or rub Abdomen: soft nontender, nondistended, with normal bowel sounds Extremities: no cyanosis, clubbing, or edema Peripheral Pulses: 2+ in all extremities, 2+ femoral pulses bilaterally  Assessment   53 y.o. male with  1. Paroxysmal atrial fibrillation (CMS-HCC)  2. Essential hypertension  3. Chronic obstructive pulmonary disease, unspecified COPD type (CMS-HCC)  4. 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  Plan   1. Continue current medications 2. Counseled patient about low-sodium diet 3. DASH diet printed instructions given to the patient 4. Counseled patient about low-cholesterol diet 5. Low-fat and cholesterol diet printed instructions given to patient 6. Permanent pacemaker implantation. The risk, benefits and alternatives of permanent pacemaker implantation were explained to the patient and informed written consent was obtained.  No orders of the defined types were placed in this encounter.  Return in about 1 week (around 03/08/2019), or after pacemaker.  Marcina Millard, MD PhD [facility]    Electronically signed by Marcina Millard, MD at 03/01/2019 2:25 PM EDT   Plan of Treatment - documented as of this encounter Not on file   Procedures - documented in this encounter Procedure Name Priority Date/Time Associated Diagnosis Comments  AMBULATORY ECG ANALYSIS  02/16/2019 12:00 AM EST  Results for this procedure are in the results section.   ECHO EXTERNAL  02/09/2019 12:00 AM EST  Results for this procedure are in the results section.   EXTERNAL RADIOLOGY RESULT -CT  02/09/2019 12:00 AM EST  Results for this procedure are in the results section.    Imaging Results - documented in this encounter Table of Contents for Imaging Results  ECHO EXTERNAL (02/09/2019 12:00 AM EST)  EXTERNAL RADIOLOGY RESULT -CT (02/09/2019 12:00 AM EST)     ECHO EXTERNAL (02/09/2019 12:00 AM EST) ECHO EXTERNAL (02/09/2019 12:00 AM EST)  Narrative Performed At  This result has an attachment that is not available.  Ordered by an unspecified provider.      Back to top of Imaging Results    EXTERNAL RADIOLOGY RESULT -CT (02/09/2019 12:00 AM EST) EXTERNAL RADIOLOGY RESULT -CT (02/09/2019 12:00 AM EST)  Narrative Performed  At  This result has an attachment that is not available.  Ordered by an unspecified provider.      Back to top of Imaging Results  ECG Results - documented in this encounter  AMBULATORY ECG ANALYSIS (02/16/2019 12:00 AM EST) AMBULATORY ECG ANALYSIS (02/16/2019 12:00 AM EST)  Narrative Performed At  This result has an attachment that is not available.  Ordered by an unspecified provider.       Visit Diagnoses - documented in this encounter Diagnosis  Paroxysmal atrial fibrillation (CMS-HCC) - Primary  Atrial fibrillation   Essential hypertension  Chronic obstructive pulmonary disease, unspecified COPD type (CMS-HCC)   Sick sinus syndrome (CMS-HCC)  Sinoatrial node dysfunction    Historical Medications - added in this encounter Reconcile with Patient's Chart This list may reflect changes made after this encounter.  Medication Sig Dispensed Refills Start Date End Date  pantoprazole (PROTONIX) 20 MG DR tablet  Take 20 mg by mouth once daily  0    tamsulosin (FLOMAX) 0.4 mg capsule  TAKE 1 CAPSULE BY MOUTH EVERY DAY IN THE MORNING  0 04/04/2018   nitroGLYcerin (NITROSTAT) 0.3 MG SL tablet  TAKE 1 TABLET UNDER TONGUE EVERY 5 MIN FOR UP TO 3 DOSES FOR CHEST PAIN AS NEEDED.  0 06/20/2018   flecainide (TAMBOCOR) 50 MG tablet  Take 50 mg by mouth every 12 (twelve) hours  0 11/23/2018   Images  Patient Contacts  Contact Name Contact Address Communication Relationship to Patient  Adrian Flores Unknown [phone] [facility]) Spouse, Emergency Contact  Document Information  Primary Care Provider Other Service Providers Document Coverage Dates  [facility] (Mar. 11, 2020March 11, 2020 - Present) DM: [phone] (Work) [phone] (Fax) [address] [place], [place] [address]  Mar. 11, 2020March 11, 2020 - Mar. 15, 2020March 15, 2020   Custodian Organization  [facility] [address] [place], [place] [address]   Encounter  Providers Encounter Date  Marcina Millard, MD (Attending) DM: [phone] (Work) [phone] (Fax) [address] [facility] [place], [place] [address] Cardiovascular Disease Mar. 11, 2020March 11, 2020 - Mar. 15, 2020March 15, 2020    Show All Sections

## 2019-03-23 NOTE — Anesthesia Procedure Notes (Signed)
Performed by: Malva Cogan, CRNA Pre-anesthesia Checklist: Patient identified, Emergency Drugs available, Suction available and Patient being monitored Oxygen Delivery Method: Simple face mask Induction Type: IV induction

## 2019-03-24 DIAGNOSIS — I495 Sick sinus syndrome: Secondary | ICD-10-CM | POA: Diagnosis not present

## 2019-03-24 MED ORDER — CEPHALEXIN 250 MG PO CAPS: 250.0000 mg | ORAL_CAPSULE | Freq: Four times a day (QID) | ORAL | 0 refills | Status: AC

## 2019-03-24 MED ORDER — SODIUM CHLORIDE 0.9 % IV SOLN
INTRAVENOUS | Status: DC | PRN
  Administered 2019-03-24: 05:00:00 250 mL via INTRAVENOUS

## 2019-03-24 NOTE — Plan of Care (Signed)
Adrian Flores had stated that he does not read well and doesn't understand reading material. I verbally reviewed all education on the patient education materials for biventricular pacemaker insertion, care after. When appropriate, demonstration was used to show how to not raise arm over shoulder and lifting restrictions. I gave the patient instructions to give to his wife upon discharge and asked that he call his wife prior to discharge so that the nursing staff can review his plan of care before he leaves. EKG completed this morning and placed in chart.

## 2019-03-24 NOTE — Anesthesia Postprocedure Evaluation (Signed)
Anesthesia Post Note  Patient: Adrian Flores  Procedure(s) Performed: INSERTION PACEMAKER DUAL CHAMBER (Left Chest)  Patient location during evaluation: PACU Anesthesia Type: MAC Level of consciousness: awake and alert Pain management: pain level controlled Vital Signs Assessment: post-procedure vital signs reviewed and stable Respiratory status: spontaneous breathing, nonlabored ventilation, respiratory function stable and patient connected to nasal cannula oxygen Cardiovascular status: blood pressure returned to baseline and stable Postop Assessment: no apparent nausea or vomiting Anesthetic complications: no     Last Vitals:  Vitals:   03/24/19 0725 03/24/19 0910  BP: 94/63 98/61  Pulse: (!) 59 (!) 59  Resp:    Temp: 36.7 C   SpO2: 95%     Last Pain:  Vitals:   03/24/19 0725  TempSrc: Oral  PainSc:                  Martha Clan

## 2019-03-24 NOTE — Discharge Summary (Addendum)
Physician Discharge Summary  Patient ID: Adrian Flores MRN: [id number] DOB/AGE: 1966/08/02 53 y.o.  Admit date: 03/23/2019 Discharge date: 03/24/2019  Primary Discharge Diagnosis Sick sinus syndrome s/p dual chamber pacemaker   Hospital Course: Adrian Flores is a 53 year old male with a past medical history significant for paroxsymal atrial fibrillation, obstructive sleep apnea, hypertension, hyperlipidemia, and sick sinus syndrome.  Underwent permanent dual chamber pacemaker insertion with Dr. Darrold Junker on 03/23/19.  There were no perioperative complications and Mr. Parady reports he is doing well.  Denies chest pain, palpitations, or shortness of breath.  Admits to mild soreness in left shoulder, but denies any significant pain.  Denies orthopnea or lower extremity swelling.  Denies fever, chills, abdominal pain, nausea, vomiting, or diarrhea.     Discharge Exam: Blood pressure 94/63, pulse (!) 59, temperature 98 F (36.7 C), temperature source Oral, resp. rate 20, height 5\' 9"  (1.753 m), weight 92.7 kg, SpO2 95 %.    General appearance: alert and no distress  HEENT:  Normocephalic, atraumatic.  PEARLA Pulmonary: Lungs clear to auscultation bilaterally.  No rales, rhonchi, or crackles.  Cardiovascular:  Paced rhythm, regular rate and rhythm. No gallops, murmurs or rubs.  Gastrointestinal:  Non-tender, non-distended.  Normoactive bowel sounds in all 4 quadrants. Integumentary:  Steri strips intact.  No evidence of erythema, warmth, or drainage. Neurology: Alert and oriented x3 Psych:  Good affect, responds appropriately  Labs:   Lab Results  Component Value Date   WBC 5.8 03/14/2019   HGB 14.4 03/14/2019   HCT 45.4 03/14/2019   MCV 86.0 03/14/2019   PLT 317 03/14/2019   No results for input(s): NA, K, CL, CO2, BUN, CREATININE, CALCIUM, PROT, BILITOT, ALKPHOS, ALT, AST, GLUCOSE in the last 168 hours.  Invalid input(s): LABALBU    Radiology: Chest x-ray: Negative for  pneumothorax or active cardiopulmonary disease  EKG: Paced rhythm   FOLLOW UP PLANS AND APPOINTMENTS  Allergies as of 03/24/2019   No Known Allergies     Medication List    TAKE these medications   albuterol 108 (90 Base) MCG/ACT inhaler Commonly known as:  PROVENTIL HFA;VENTOLIN HFA Inhale 2 puffs into the lungs every 6 (six) hours as needed for wheezing or shortness of breath.   cephALEXin 250 MG capsule Commonly known as:  Keflex Take 1 capsule (250 mg total) by mouth 4 (four) times daily for 7 days.   flecainide 50 MG tablet Commonly known as:  TAMBOCOR Take 50 mg by mouth 2 (two) times daily.   isosorbide mononitrate 30 MG 24 hr tablet Commonly known as:  Imdur Take 1 tablet (30 mg total) by mouth daily.   multivitamin with minerals Tabs tablet Take 1 tablet by mouth daily.   nitroGLYCERIN 0.3 MG SL tablet Commonly known as:  NITROSTAT Place 0.3 mg under the tongue every 5 (five) minutes as needed for chest pain.   pantoprazole 20 MG tablet Commonly known as:  PROTONIX Take 20 mg by mouth 2 (two) times daily.   tamsulosin 0.4 MG Caps capsule Commonly known as:  FLOMAX Take 0.4 mg by mouth every morning.      Follow-up Information    Paraschos, Alexander, MD Follow up in 10 day(s).   Specialty:  Cardiology Contact information: [address] [facility] [place] [address] [phone]           BRING ALL MEDICATIONS WITH YOU TO FOLLOW UP APPOINTMENTS  Time spent with patient to include physician time: 30 minutes  The history, physical exam findings, and plan of care were all discussed with Dr. Harold Hedge, and all decision making was made in collaboration.   Signed:  Andi Hence PA-C 03/24/2019, 8:15 AM

## 2019-03-24 NOTE — Discharge Instructions (Signed)
Avoid lifting your left arm over your head, avoid lifting greater than 15 pounds, and avoid sleeping on your left side for 6-8 weeks.  You may shower tomorrow, but don't let the water directly hit the incision site.  Allow the steri strips to fall off on their own, otherwise we will take them off at your 10 day follow up appointment.

## 2019-03-27 ENCOUNTER — Encounter: Payer: Self-pay | Admitting: Nurse Practitioner

## 2019-03-30 DIAGNOSIS — Z950 Presence of cardiac pacemaker: Secondary | ICD-10-CM | POA: Insufficient documentation

## 2019-04-18 ENCOUNTER — Other Ambulatory Visit: Payer: Self-pay

## 2019-04-18 ENCOUNTER — Encounter: Payer: Self-pay | Admitting: Nurse Practitioner

## 2019-04-18 ENCOUNTER — Ambulatory Visit: Payer: Medicaid Other | Admitting: Nurse Practitioner

## 2019-04-18 VITALS — BP 110/67 | HR 63 | Resp 16 | Ht 69.0 in | Wt 205.2 lb

## 2019-04-18 DIAGNOSIS — R300 Dysuria: Secondary | ICD-10-CM

## 2019-04-18 DIAGNOSIS — I4891 Unspecified atrial fibrillation: Secondary | ICD-10-CM | POA: Diagnosis not present

## 2019-04-18 DIAGNOSIS — Z9989 Dependence on other enabling machines and devices: Secondary | ICD-10-CM

## 2019-04-18 DIAGNOSIS — Z0001 Encounter for general adult medical examination with abnormal findings: Secondary | ICD-10-CM | POA: Diagnosis not present

## 2019-04-18 DIAGNOSIS — Z950 Presence of cardiac pacemaker: Secondary | ICD-10-CM

## 2019-04-18 NOTE — Progress Notes (Signed)
[facility] [address] [place], [place] [id number]  Internal MEDICINE  Office Visit Note  Patient Name: Adrian Flores  [id number]  [id number]  Date of Service: 05/07/2019   Pt is here for routine health maintenance examination  Chief Complaint  Patient presents with  . Annual Exam  . Gastroesophageal Reflux  . Hypertension  . COPD  . Sleep Apnea     The patient is here for health maintenance exam. Currently has no concerns or complaints. Did have pacemaker placed at the beginning of the month. Follows up regularly with cardiology. Has tolerated the pacemaker well. He is due to have routine, fasting labs done. He had cologuard test done last year with his prior PCP and it was negative.     Current Medication: Outpatient Encounter Medications as of 04/18/2019  Medication Sig  . flecainide (TAMBOCOR) 50 MG tablet Take 50 mg by mouth 2 (two) times daily.  . Multiple Vitamin (MULTIVITAMIN WITH MINERALS) TABS tablet Take 1 tablet by mouth daily.  . nitroGLYCERIN (NITROSTAT) 0.3 MG SL tablet Place 0.3 mg under the tongue every 5 (five) minutes as needed for chest pain.  . pantoprazole (PROTONIX) 20 MG tablet Take 20 mg by mouth 2 (two) times daily.  . tamsulosin (FLOMAX) 0.4 MG CAPS capsule Take 0.4 mg by mouth every morning.  Marland Kitchen. albuterol (PROVENTIL HFA;VENTOLIN HFA) 108 (90 Base) MCG/ACT inhaler Inhale 2 puffs into the lungs every 6 (six) hours as needed for wheezing or shortness of breath. (Patient not taking: Reported on 03/06/2019)  . isosorbide mononitrate (IMDUR) 30 MG 24 hr tablet Take 1 tablet (30 mg total) by mouth daily. (Patient not taking: Reported on 03/06/2019)   No facility-administered encounter medications on file as of 04/18/2019.     Surgical History: Past Surgical History:  Procedure Laterality Date  . correction eyelid retraction w/ implantation upper eyelid load    . PACEMAKER INSERTION Left 03/23/2019   Procedure: INSERTION PACEMAKER DUAL  CHAMBER;  Surgeon: Marcina MillardParaschos, Alexander, MD;  Location: [facility] ORS;  Service: Cardiovascular;  Laterality: Left;    Medical History: Past Medical History:  Diagnosis Date  . A-fib (HCC)   . Bradycardia   . COPD (chronic obstructive pulmonary disease) (HCC)   . GERD (gastroesophageal reflux disease)   . Hypertension   . Leaky heart valve   . Sleep apnea     Family History: Family History  Problem Relation Age of Onset  . Heart disease Mother       Review of Systems  Constitutional: Negative for chills, fatigue and unexpected weight change.  HENT: Negative for congestion, rhinorrhea, sinus pressure, sinus pain, sneezing and sore throat.   Eyes: Negative for redness.  Respiratory: Negative for cough, chest tightness, shortness of breath and wheezing.   Cardiovascular: Negative for chest pain and palpitations.       Recently had pacemaker placed. Followed by cariology regularly.   Gastrointestinal: Negative for abdominal pain, constipation, diarrhea, nausea and vomiting.  Endocrine: Negative for cold intolerance, heat intolerance, polydipsia and polyuria.  Genitourinary: Negative for dysuria, frequency and urgency.  Musculoskeletal: Negative for arthralgias, back pain, joint swelling and neck pain.  Skin: Negative for rash.  Allergic/Immunologic: Negative for environmental allergies.  Neurological: Negative for dizziness, tremors, numbness and headaches.  Hematological: Negative for adenopathy. Does not bruise/bleed easily.  Psychiatric/Behavioral: Negative for behavioral problems, sleep disturbance and suicidal ideas. The patient is not nervous/anxious.     Today's Vitals   04/18/19 1442  BP: 110/67  Pulse:  63  Resp: 16  SpO2: 96%  Weight: 205 lb 3.2 oz (93.1 kg)  Height:  (1.753 m)   Body mass index is 30.3 kg/m.   Physical Exam Vitals signs and nursing note reviewed.  Constitutional:      General: He is not in acute distress.    Appearance: Normal  appearance. He is well-developed. He is not diaphoretic.  HENT:     Head: Normocephalic and atraumatic.     Mouth/Throat:     Pharynx: No oropharyngeal exudate.  Eyes:     Extraocular Movements: Extraocular movements intact.     Conjunctiva/sclera: Conjunctivae normal.     Pupils: Pupils are equal, round, and reactive to light.  Neck:     Musculoskeletal: Normal range of motion and neck supple.     Thyroid: No thyromegaly.     Vascular: No carotid bruit or JVD.     Trachea: No tracheal deviation.  Cardiovascular:     Rate and Rhythm: Normal rate and regular rhythm.     Pulses: Normal pulses.     Heart sounds: Normal heart sounds. No murmur. No friction rub. No gallop.   Pulmonary:     Effort: Pulmonary effort is normal. No respiratory distress.     Breath sounds: Normal breath sounds. No wheezing or rales.  Chest:     Chest wall: No tenderness.  Abdominal:     General: Abdomen is flat. Bowel sounds are normal.     Palpations: Abdomen is soft.     Tenderness: There is no abdominal tenderness. There is no guarding.  Musculoskeletal: Normal range of motion.  Lymphadenopathy:     Cervical: No cervical adenopathy.  Skin:    General: Skin is warm and dry.  Neurological:     Mental Status: He is alert and oriented to person, place, and time. Mental status is at baseline.     Cranial Nerves: No cranial nerve deficit.  Psychiatric:        Behavior: Behavior normal.        Thought Content: Thought content normal.        Judgment: Judgment normal.      LABS: Recent Results (from the past 2160 hour(s))  CBC     Status: None   Collection Time: 03/14/19  8:51 AM  Result Value Ref Range   WBC 5.8 4.0 - 10.5 K/uL   RBC 5.28 4.22 - 5.81 MIL/uL   Hemoglobin 14.4 13.0 - 17.0 g/dL   HCT 04.5 40.9 - 81.1 %   MCV 86.0 80.0 - 100.0 fL   MCH 27.3 26.0 - 34.0 pg   MCHC 31.7 30.0 - 36.0 g/dL   RDW 91.4 78.2 - 95.6 %   Platelets 317 150 - 400 K/uL   nRBC 0.0 0.0 - 0.2 %    Comment:  Performed at [facility], [address] [place], [place] [address]  Basic metabolic panel     Status: Abnormal   Collection Time: 03/14/19  8:51 AM  Result Value Ref Range   Sodium 138 135 - 145 mmol/L   Potassium 4.3 3.5 - 5.1 mmol/L   Chloride 100 98 - 111 mmol/L   CO2 30 22 - 32 mmol/L   Glucose, Bld 114 (H) 70 - 99 mg/dL   BUN 17 6 - 20 mg/dL   Creatinine, Ser 6.57 (H) 0.61 - 1.24 mg/dL   Calcium 9.3 8.9 - 84.6 mg/dL   GFR calc non Af Amer >60 >60 mL/min   GFR  calc Af Amer >60 >60 mL/min   Anion gap 8 5 - 15    Comment: Performed at [facility], [address] [place], [place] [address]  Protime-INR     Status: None   Collection Time: 03/14/19  8:51 AM  Result Value Ref Range   Prothrombin Time 12.3 11.4 - 15.2 seconds   INR 0.9 0.8 - 1.2    Comment: (NOTE) INR goal varies based on device and disease states. Performed at [facility], [address] [place], [place] [address]   APTT     Status: None   Collection Time: 03/14/19  8:51 AM  Result Value Ref Range   aPTT 31 24 - 36 seconds    Comment: Performed at [facility], [address] [place], [place] [address]  Surgical pcr screen     Status: None   Collection Time: 03/14/19  8:56 AM  Result Value Ref Range   MRSA, PCR NEGATIVE NEGATIVE   Staphylococcus aureus NEGATIVE NEGATIVE    Comment: (NOTE) The Xpert SA Assay (FDA approved for NASAL specimens in patients 68 years of age and older), is one component of a comprehensive surveillance program. It is not intended to diagnose infection nor to guide or monitor treatment. Performed at [facility], [address] [place], [place] [address]   UA/M w/rflx Culture, Routine     Status: None   Collection Time: 04/18/19  3:01 PM  Result Value Ref Range   Specific Gravity, UA 1.028 1.005 - 1.030   pH, UA 5.0 5.0 - 7.5   Color, UA Yellow Yellow   Appearance Ur Clear Clear   Leukocytes,UA Negative Negative    Protein,UA Trace Negative/Trace   Glucose, UA Negative Negative   Ketones, UA Negative Negative   RBC, UA Negative Negative   Bilirubin, UA Negative Negative   Urobilinogen, Ur 0.2 0.2 - 1.0 mg/dL   Nitrite, UA Negative Negative   Microscopic Examination Comment     Comment: Microscopic follows if indicated.   Microscopic Examination See below:     Comment: Microscopic was indicated and was performed.   Urinalysis Reflex Comment     Comment: This specimen will not reflex to a Urine Culture.  Microscopic Examination     Status: Abnormal   Collection Time: 04/18/19  3:01 PM  Result Value Ref Range   WBC, UA 0-5 0 - 5 /hpf   RBC 0-2 0 - 2 /hpf   Epithelial Cells (non renal) None seen 0 - 10 /hpf   Casts None seen None seen /lpf   Crystals Present (A) N/A   Crystal Type Calcium Oxalate N/A   Mucus, UA Present Not Estab.   Bacteria, UA Few None seen/Few    Assessment/Plan: 1. Encounter for general adult medical examination with abnormal findings Annual health maintenance exam today.  2. Atrial fibrillation, unspecified type (HCC) Well regulated. Regularly sees cardiology.  3. Pacemaker Recently placed. Follow up with cardiology as scheduled.   4. CPAP (continuous positive airway pressure) dependence Continue regular visits with Dr. Freda Munro for management of CPAP  5. Dysuria - UA/M w/rflx Culture, Routine  General Counseling: khayree delellis understanding of the findings of todays visit and agrees with plan of treatment. I have discussed any further diagnostic evaluation that may be needed or ordered today. We also reviewed his medications today. he has been encouraged to call the office with any questions or concerns that should arise related to todays visit.  Counseling:   This patient was seen by Vincent Gros FNP Collaboration with Dr Lyndon Code as a part of collaborative care agreement  Orders Placed This Encounter  Procedures  . Microscopic Examination   . UA/M w/rflx Culture, Routine     Time spent: 45 Minutes      Lyndon Code, MD  Internal Medicine

## 2019-04-19 LAB — UA/M W/RFLX CULTURE, ROUTINE
Bilirubin, UA: NEGATIVE
Glucose, UA: NEGATIVE
Ketones, UA: NEGATIVE
Leukocytes,UA: NEGATIVE
Nitrite, UA: NEGATIVE
RBC, UA: NEGATIVE
Specific Gravity, UA: 1.028 (ref 1.005–1.030)
Urobilinogen, Ur: 0.2 mg/dL (ref 0.2–1.0)
pH, UA: 5 (ref 5.0–7.5)

## 2019-04-19 LAB — MICROSCOPIC EXAMINATION
Casts: NONE SEEN /lpf
Epithelial Cells (non renal): NONE SEEN /hpf (ref 0–10)

## 2019-05-07 DIAGNOSIS — R300 Dysuria: Secondary | ICD-10-CM | POA: Insufficient documentation

## 2019-05-07 DIAGNOSIS — Z0001 Encounter for general adult medical examination with abnormal findings: Secondary | ICD-10-CM | POA: Insufficient documentation

## 2019-05-07 DIAGNOSIS — Z9989 Dependence on other enabling machines and devices: Secondary | ICD-10-CM | POA: Insufficient documentation

## 2019-06-26 ENCOUNTER — Encounter: Payer: Self-pay | Admitting: Nurse Practitioner

## 2019-06-26 ENCOUNTER — Ambulatory Visit: Payer: Medicaid Other | Admitting: Nurse Practitioner

## 2019-06-26 ENCOUNTER — Other Ambulatory Visit: Payer: Self-pay

## 2019-06-26 VITALS — BP 105/79 | HR 60 | Temp 98.1°F | Resp 16 | Ht 69.0 in | Wt 206.4 lb

## 2019-06-26 DIAGNOSIS — H6122 Impacted cerumen, left ear: Secondary | ICD-10-CM

## 2019-06-26 NOTE — Progress Notes (Signed)
[facility] [place], [place] [address]  Internal MEDICINE  Office Visit Note  Patient Name: Adrian Flores  [id number]  [id number]  Date of Service: 06/26/2019  Chief Complaint  Patient presents with  . Ear Pain    left ear, feels like it is closing up and cannot hear out of it     The patient states that his left ear feels like its closing up. Does not hurt. Has no fever or other symptoms associated with sinusitis. Has has not been swimming or in water. Denies headache or congestion. He has not tried any over the counter meds for earache relief of swimmer's ear. Did take motrin a few times which did help a little.   Pt is here for a sick visit.     Current Medication:  Outpatient Encounter Medications as of 06/26/2019  Medication Sig  . Multiple Vitamin (MULTIVITAMIN WITH MINERALS) TABS tablet Take 1 tablet by mouth daily.  . nitroGLYCERIN (NITROSTAT) 0.3 MG SL tablet Place 0.3 mg under the tongue every 5 (five) minutes as needed for chest pain.  . pantoprazole (PROTONIX) 20 MG tablet Take 20 mg by mouth 2 (two) times daily.  . [DISCONTINUED] albuterol (PROVENTIL HFA;VENTOLIN HFA) 108 (90 Base) MCG/ACT inhaler Inhale 2 puffs into the lungs every 6 (six) hours as needed for wheezing or shortness of breath. (Patient not taking: Reported on 03/06/2019)  . [DISCONTINUED] flecainide (TAMBOCOR) 50 MG tablet Take 50 mg by mouth 2 (two) times daily.  . [DISCONTINUED] isosorbide mononitrate (IMDUR) 30 MG 24 hr tablet Take 1 tablet (30 mg total) by mouth daily. (Patient not taking: Reported on 03/06/2019)  . [DISCONTINUED] tamsulosin (FLOMAX) 0.4 MG CAPS capsule Take 0.4 mg by mouth every morning.   No facility-administered encounter medications on file as of 06/26/2019.       Medical History: Past Medical History:  Diagnosis Date  . A-fib ([place])   . Bradycardia   . COPD (chronic obstructive pulmonary disease) ([place])   . GERD (gastroesophageal reflux  disease)   . Hypertension   . Leaky heart valve   . Sleep apnea     Today's Vitals   06/26/19 0856  BP: 105/79  Pulse: 60  Resp: 16  Temp: 98.1 F (36.7 C)  SpO2: 97%  Weight: 206 lb 6.4 oz (93.6 kg)  Height: 5' 9"  (1.753 m)   Body mass index is 30.48 kg/m.  Review of Systems  Constitutional: Negative for chills, fatigue and unexpected weight change.  HENT: Negative for congestion, postnasal drip, rhinorrhea, sneezing and sore throat.        Left ear feels as though it's closing up. Does not hurt. No drainage from the ear.   Respiratory: Negative for cough, chest tightness and shortness of breath.   Cardiovascular: Negative for chest pain and palpitations.  Gastrointestinal: Negative for abdominal pain, constipation, diarrhea, nausea and vomiting.  Musculoskeletal: Negative for arthralgias, back pain, joint swelling and neck pain.  Skin: Negative for rash.  Allergic/Immunologic: Negative for environmental allergies.  Neurological: Negative for dizziness, tremors, numbness and headaches.  Hematological: Negative for adenopathy. Does not bruise/bleed easily.  Psychiatric/Behavioral: Negative for behavioral problems (Depression), sleep disturbance and suicidal ideas. The patient is not nervous/anxious.     Physical Exam Vitals signs and nursing note reviewed.  Constitutional:      General: He is not in acute distress.    Appearance: Normal appearance. He is well-developed. He is not diaphoretic.  HENT:     Head:  Normocephalic and atraumatic.     Right Ear: Ear canal and external ear normal.     Left Ear: Ear canal normal.     Ears:     Comments: Moderate amount of cerumen in left external ear canal.     Mouth/Throat:     Pharynx: No oropharyngeal exudate.  Eyes:     Pupils: Pupils are equal, round, and reactive to light.  Neck:     Musculoskeletal: Normal range of motion and neck supple.     Thyroid: No thyromegaly.     Vascular: No JVD.     Trachea: No tracheal  deviation.  Cardiovascular:     Rate and Rhythm: Normal rate and regular rhythm.     Heart sounds: Normal heart sounds. No murmur. No friction rub. No gallop.   Pulmonary:     Effort: Pulmonary effort is normal. No respiratory distress.     Breath sounds: No wheezing or rales.  Chest:     Chest wall: No tenderness.  Abdominal:     General: Bowel sounds are normal.     Palpations: Abdomen is soft.  Musculoskeletal: Normal range of motion.  Lymphadenopathy:     Cervical: No cervical adenopathy.  Skin:    General: Skin is warm and dry.  Neurological:     Mental Status: He is alert and oriented to person, place, and time.     Cranial Nerves: No cranial nerve deficit.  Psychiatric:        Behavior: Behavior normal.        Thought Content: Thought content normal.        Judgment: Judgment normal.   Assessment/Plan: 1. Left ear impacted cerumen Left ear canal with moderate wax. Recommend use of Debrox OTC wax removal kit. Use as directed. Advised the patient to contact the office in few days if symptoms worsen or do not improve.   General Counseling: rolla kedzierski understanding of the findings of todays visit and agrees with plan of treatment. I have discussed any further diagnostic evaluation that may be needed or ordered today. We also reviewed his medications today. he has been encouraged to call the office with any questions or concerns that should arise related to todays visit.    Counseling:  This patient was seen by Leretha Pol FNP Collaboration with Dr Lavera Guise as a part of collaborative care agreement  Time spent: 15 Minutes

## 2019-10-24 ENCOUNTER — Ambulatory Visit: Payer: Medicaid Other | Admitting: Adult Health

## 2019-10-30 ENCOUNTER — Telehealth: Payer: Self-pay

## 2019-10-30 NOTE — Telephone Encounter (Signed)
Called pt lmom informing of appt. klh

## 2019-11-01 ENCOUNTER — Ambulatory Visit (INDEPENDENT_AMBULATORY_CARE_PROVIDER_SITE_OTHER): Payer: Medicaid Other | Admitting: Adult Health

## 2019-11-01 ENCOUNTER — Encounter: Payer: Self-pay | Admitting: Adult Health

## 2019-11-01 VITALS — BP 108/72 | HR 61 | Temp 98.4°F | Resp 16 | Ht 69.0 in | Wt 206.0 lb

## 2019-11-01 DIAGNOSIS — K219 Gastro-esophageal reflux disease without esophagitis: Secondary | ICD-10-CM

## 2019-11-01 DIAGNOSIS — G4733 Obstructive sleep apnea (adult) (pediatric): Secondary | ICD-10-CM | POA: Diagnosis not present

## 2019-11-01 DIAGNOSIS — R001 Bradycardia, unspecified: Secondary | ICD-10-CM

## 2019-11-01 DIAGNOSIS — I4891 Unspecified atrial fibrillation: Secondary | ICD-10-CM

## 2019-11-01 DIAGNOSIS — Z9989 Dependence on other enabling machines and devices: Secondary | ICD-10-CM

## 2019-11-01 NOTE — Progress Notes (Signed)
[facility] [place], [place] [address]  Internal MEDICINE  Office Visit Note  Patient Name: Adrian Flores  [id number]  [id number]  Date of Service: 11/01/2019  Chief Complaint  Patient presents with  . Hypertension  . Gastroesophageal Reflux  . Leg Problem    back of left leg, by the calf felt wet    HPI  Pt is here for 6 month follow up.  He has a history of HTN, GERD, A-Fib, and pacemaker placement. His HTN and GERD are well controlled at this time.  He has had one episode where he believes his pacemaker was firing.  He recently followed up with cardiology, and has another appointment in two months.   He reports yesterday he had the sensation that his popliteal region of his left knee was wet. He reports it lasted for a few seconds, and his pants did not get wet.  He could never find any moisture on his knee or leg.  It has not happened again.    Current Medication: Outpatient Encounter Medications as of 11/01/2019  Medication Sig  . Multiple Vitamin (MULTIVITAMIN WITH MINERALS) TABS tablet Take 1 tablet by mouth daily.  . nitroGLYCERIN (NITROSTAT) 0.3 MG SL tablet Place 0.3 mg under the tongue every 5 (five) minutes as needed for chest pain.  . pantoprazole (PROTONIX) 20 MG tablet Take 20 mg by mouth 2 (two) times daily.   No facility-administered encounter medications on file as of 11/01/2019.     Surgical History: Past Surgical History:  Procedure Laterality Date  . correction eyelid retraction w/ implantation upper eyelid load    . PACEMAKER INSERTION Left 03/23/2019   Procedure: INSERTION PACEMAKER DUAL CHAMBER;  Surgeon: Isaias Cowman, MD;  Location: [facility] ORS;  Service: Cardiovascular;  Laterality: Left;    Medical History: Past Medical History:  Diagnosis Date  . A-fib ([place])   . Bradycardia   . COPD (chronic obstructive pulmonary disease) ([place])   . GERD (gastroesophageal reflux disease)   . Hypertension   . Leaky heart  valve   . Sleep apnea     Family History: Family History  Problem Relation Age of Onset  . Heart disease Mother     Social History   Socioeconomic History  . Marital status: Married    Spouse name: Not on file  . Number of children: Not on file  . Years of education: Not on file  . Highest education level: Not on file  Occupational History  . Not on file  Social Needs  . Financial resource strain: Not on file  . Food insecurity    Worry: Not on file    Inability: Not on file  . Transportation needs    Medical: Not on file    Non-medical: Not on file  Tobacco Use  . Smoking status: Former Research scientist (life sciences)  . Smokeless tobacco: Never Used  Substance and Sexual Activity  . Alcohol use: No  . Drug use: Not Currently    Comment: teenager  . Sexual activity: Never  Lifestyle  . Physical activity    Days per week: Not on file    Minutes per session: Not on file  . Stress: Not on file  Relationships  . Social Herbalist on phone: Not on file    Gets together: Not on file    Attends religious service: Not on file    Active member of club or organization: Not on file    Attends meetings  of clubs or organizations: Not on file    Relationship status: Not on file  . Intimate partner violence    Fear of current or ex partner: Not on file    Emotionally abused: Not on file    Physically abused: Not on file    Forced sexual activity: Not on file  Other Topics Concern  . Not on file  Social History Narrative  . Not on file      Review of Systems  Constitutional: Negative.  Negative for chills, fatigue and unexpected weight change.  HENT: Negative.  Negative for congestion, rhinorrhea, sneezing and sore throat.   Eyes: Negative for redness.  Respiratory: Negative.  Negative for cough, chest tightness and shortness of breath.   Cardiovascular: Negative.  Negative for chest pain and palpitations.  Gastrointestinal: Negative.  Negative for abdominal pain, constipation,  diarrhea, nausea and vomiting.  Endocrine: Negative.   Genitourinary: Negative.  Negative for dysuria and frequency.  Musculoskeletal: Negative.  Negative for arthralgias, back pain, joint swelling and neck pain.  Skin: Negative.  Negative for rash.       Brief wet sensation to popliteal area of left knee.  Denies reoccurrence.   Allergic/Immunologic: Negative.   Neurological: Negative.  Negative for tremors and numbness.  Hematological: Negative for adenopathy. Does not bruise/bleed easily.  Psychiatric/Behavioral: Negative.  Negative for behavioral problems, sleep disturbance and suicidal ideas. The patient is not nervous/anxious.     Vital Signs: BP 108/72   Pulse 61   Temp 98.4 F (36.9 C)   Resp 16   Ht  (1.753 m)   Wt 206 lb (93.4 kg)   SpO2 94%   BMI 30.42 kg/m    Physical Exam Vitals signs and nursing note reviewed.  Constitutional:      General: He is not in acute distress.    Appearance: He is well-developed. He is not diaphoretic.  HENT:     Head: Normocephalic and atraumatic.     Mouth/Throat:     Pharynx: No oropharyngeal exudate.  Eyes:     Pupils: Pupils are equal, round, and reactive to light.  Neck:     Musculoskeletal: Normal range of motion and neck supple.     Thyroid: No thyromegaly.     Vascular: No JVD.     Trachea: No tracheal deviation.  Cardiovascular:     Rate and Rhythm: Normal rate and regular rhythm.     Heart sounds: Normal heart sounds. No murmur. No friction rub. No gallop.   Pulmonary:     Effort: Pulmonary effort is normal. No respiratory distress.     Breath sounds: Normal breath sounds. No wheezing or rales.  Chest:     Chest wall: No tenderness.  Abdominal:     Palpations: Abdomen is soft.     Tenderness: There is no abdominal tenderness. There is no guarding.  Musculoskeletal: Normal range of motion.  Lymphadenopathy:     Cervical: No cervical adenopathy.  Skin:    General: Skin is warm and dry.  Neurological:      Mental Status: He is alert and oriented to person, place, and time.     Cranial Nerves: No cranial nerve deficit.  Psychiatric:        Behavior: Behavior normal.        Thought Content: Thought content normal.        Judgment: Judgment normal.     Assessment/Plan: 1. OSA on CPAP Continue to wear cpap as directed. Discussed importance of  continues compliance with CPAP therapy.   2. Atrial fibrillation, unspecified type (HCC) Continue to follow up with cardiology as directed.   3. Bradycardia Pacemaker in place, continue to follow up as scheduled.   4. Gastroesophageal reflux disease without esophagitis Controlled, continue current medications.   General Counseling: sylvester minton understanding of the findings of todays visit and agrees with plan of treatment. I have discussed any further diagnostic evaluation that may be needed or ordered today. We also reviewed his medications today. he has been encouraged to call the office with any questions or concerns that should arise related to todays visit.    No orders of the defined types were placed in this encounter.   No orders of the defined types were placed in this encounter.   Time spent: 25 Minutes   This patient was seen by Blima Ledger AGNP-C in Collaboration with Dr Lyndon Code as a part of collaborative care agreement     Johnna Acosta AGNP-C Internal medicine

## 2019-12-02 ENCOUNTER — Other Ambulatory Visit: Payer: Self-pay | Admitting: Internal Medicine

## 2019-12-02 DIAGNOSIS — R001 Bradycardia, unspecified: Secondary | ICD-10-CM

## 2019-12-02 DIAGNOSIS — Z0001 Encounter for general adult medical examination with abnormal findings: Secondary | ICD-10-CM

## 2019-12-02 DIAGNOSIS — K219 Gastro-esophageal reflux disease without esophagitis: Secondary | ICD-10-CM

## 2019-12-02 DIAGNOSIS — I4891 Unspecified atrial fibrillation: Secondary | ICD-10-CM

## 2019-12-02 DIAGNOSIS — Z125 Encounter for screening for malignant neoplasm of prostate: Secondary | ICD-10-CM

## 2020-01-24 IMAGING — DX CHEST  1 VIEW
1 series · 1 of 1 positions shown · non-contrast
Comparison: None.

CLINICAL DATA: Postoperative status.

EXAM:
CHEST  1 VIEW

[chest ap]
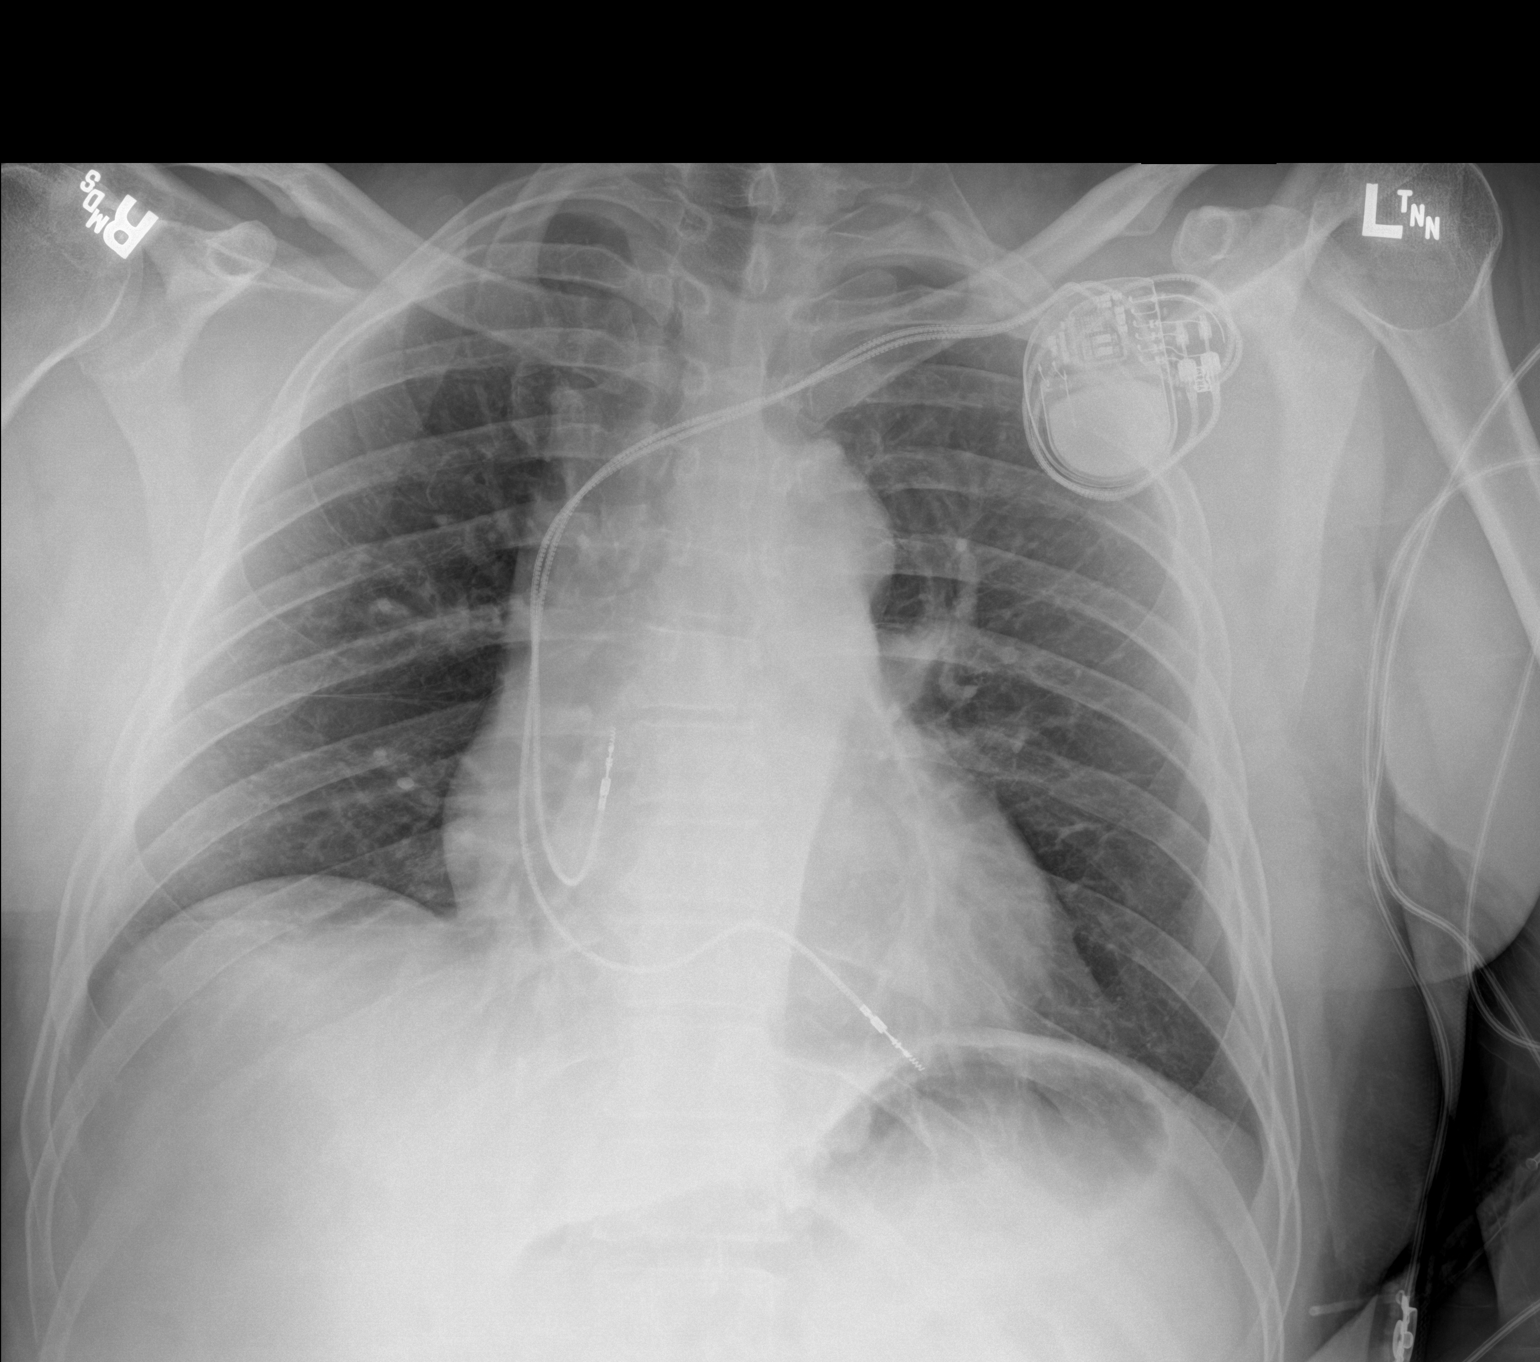

[1 of 1 positions shown; findings below may reference images not displayed]

FINDINGS: The heart size and mediastinal contours are within normal limits.
Both lungs are clear. No pneumothorax or pleural effusion is noted.
Left-sided pacemaker is unchanged in position. The visualized
skeletal structures are unremarkable.
IMPRESSION: No active disease.

## 2020-04-26 ENCOUNTER — Telehealth: Payer: Self-pay

## 2020-04-26 NOTE — Telephone Encounter (Signed)
Called lmom informing patient of appointment on 04/30/2020. klh 

## 2020-04-29 ENCOUNTER — Telehealth: Payer: Self-pay

## 2020-04-29 NOTE — Telephone Encounter (Signed)
Patient rescheduled appointment on 04/30/2020 to 05/07/2020. klh

## 2020-04-30 ENCOUNTER — Ambulatory Visit: Payer: Medicaid Other | Admitting: Adult Health

## 2020-05-01 ENCOUNTER — Other Ambulatory Visit
Admission: RE | Admit: 2020-05-01 | Discharge: 2020-05-01 | Disposition: A | Discharge status: Home / Self Care | Payer: Medicaid Other | Attending: Nurse Practitioner | Admitting: Nurse Practitioner

## 2020-05-01 DIAGNOSIS — Z0001 Encounter for general adult medical examination with abnormal findings: Secondary | ICD-10-CM | POA: Diagnosis not present

## 2020-05-01 DIAGNOSIS — I10 Essential (primary) hypertension: Secondary | ICD-10-CM | POA: Diagnosis not present

## 2020-05-01 DIAGNOSIS — Z125 Encounter for screening for malignant neoplasm of prostate: Secondary | ICD-10-CM | POA: Insufficient documentation

## 2020-05-01 LAB — CBC WITH DIFFERENTIAL/PLATELET
Abs Immature Granulocytes: 0.02 10*3/uL (ref 0.00–0.07)
Basophils Absolute: 0.1 10*3/uL (ref 0.0–0.1)
Basophils Relative: 1 %
Eosinophils Absolute: 0.1 10*3/uL (ref 0.0–0.5)
Eosinophils Relative: 2 %
HCT: 42 % (ref 39.0–52.0)
Hemoglobin: 13.3 g/dL (ref 13.0–17.0)
Immature Granulocytes: 0 %
Lymphocytes Relative: 26 %
Lymphs Abs: 1.6 10*3/uL (ref 0.7–4.0)
MCH: 25.8 pg — ABNORMAL LOW (ref 26.0–34.0)
MCHC: 31.7 g/dL (ref 30.0–36.0)
MCV: 81.4 fL (ref 80.0–100.0)
Monocytes Absolute: 0.8 10*3/uL (ref 0.1–1.0)
Monocytes Relative: 12 %
Neutro Abs: 3.8 10*3/uL (ref 1.7–7.7)
Neutrophils Relative %: 59 %
Platelets: 270 10*3/uL (ref 150–400)
RBC: 5.16 MIL/uL (ref 4.22–5.81)
RDW: 15.5 % (ref 11.5–15.5)
WBC: 6.4 10*3/uL (ref 4.0–10.5)
nRBC: 0 % (ref 0.0–0.2)

## 2020-05-01 LAB — LIPID PANEL
Cholesterol: 139 mg/dL (ref 0–200)
HDL: 41 mg/dL (ref >40)
LDL Cholesterol: 86 mg/dL (ref 0–99)
Total CHOL/HDL Ratio: 3.4 RATIO
Triglycerides: 58 mg/dL (ref <150)
VLDL: 12 mg/dL (ref 0–40)

## 2020-05-01 LAB — COMPREHENSIVE METABOLIC PANEL
ALT: 32 U/L (ref 0–44)
AST: 35 U/L (ref 15–41)
Albumin: 4.2 g/dL (ref 3.5–5.0)
Alkaline Phosphatase: 85 U/L (ref 38–126)
Anion gap: 8 (ref 5–15)
BUN: 19 mg/dL (ref 6–20)
CO2: 28 mmol/L (ref 22–32)
Calcium: 8.9 mg/dL (ref 8.9–10.3)
Chloride: 101 mmol/L (ref 98–111)
Creatinine, Ser: 1.31 mg/dL — ABNORMAL HIGH (ref 0.61–1.24)
GFR calc Af Amer: >60 mL/min (ref >60)
GFR calc non Af Amer: >60 mL/min (ref >60)
Glucose, Bld: 116 mg/dL — ABNORMAL HIGH (ref 70–99)
Potassium: 4 mmol/L (ref 3.5–5.1)
Sodium: 137 mmol/L (ref 135–145)
Total Bilirubin: 1.6 mg/dL — ABNORMAL HIGH (ref 0.3–1.2)
Total Protein: 7.8 g/dL (ref 6.5–8.1)

## 2020-05-01 LAB — T4, FREE: Free T4: 0.73 ng/dL (ref 0.61–1.12)

## 2020-05-01 LAB — PSA: Prostatic Specific Antigen: 0.63 ng/mL (ref 0.00–4.00)

## 2020-05-01 LAB — TSH: TSH: 4.502 u[IU]/mL — ABNORMAL HIGH (ref 0.350–4.500)

## 2020-05-02 NOTE — Progress Notes (Signed)
Hey. This guy sees you 05/07/2020

## 2020-05-03 ENCOUNTER — Telehealth: Payer: Self-pay

## 2020-05-03 NOTE — Telephone Encounter (Signed)
Confirmed appointment on 05/07/2020 and screened for covid. klh

## 2020-05-07 ENCOUNTER — Other Ambulatory Visit: Payer: Self-pay

## 2020-05-07 ENCOUNTER — Encounter: Payer: Self-pay | Admitting: Adult Health

## 2020-05-07 ENCOUNTER — Ambulatory Visit: Payer: Medicaid Other | Admitting: Adult Health

## 2020-05-07 VITALS — BP 116/82 | HR 60 | Temp 97.4°F | Resp 16 | Ht 69.0 in | Wt 203.2 lb

## 2020-05-07 DIAGNOSIS — Z0001 Encounter for general adult medical examination with abnormal findings: Secondary | ICD-10-CM

## 2020-05-07 DIAGNOSIS — R7301 Impaired fasting glucose: Secondary | ICD-10-CM | POA: Diagnosis not present

## 2020-05-07 DIAGNOSIS — R7989 Other specified abnormal findings of blood chemistry: Secondary | ICD-10-CM

## 2020-05-07 DIAGNOSIS — Z9989 Dependence on other enabling machines and devices: Secondary | ICD-10-CM

## 2020-05-07 DIAGNOSIS — Z6830 Body mass index (BMI) 30.0-30.9, adult: Secondary | ICD-10-CM

## 2020-05-07 DIAGNOSIS — R7303 Prediabetes: Secondary | ICD-10-CM | POA: Diagnosis not present

## 2020-05-07 DIAGNOSIS — Z950 Presence of cardiac pacemaker: Secondary | ICD-10-CM

## 2020-05-07 DIAGNOSIS — I4891 Unspecified atrial fibrillation: Secondary | ICD-10-CM

## 2020-05-07 DIAGNOSIS — K219 Gastro-esophageal reflux disease without esophagitis: Secondary | ICD-10-CM

## 2020-05-07 DIAGNOSIS — G4733 Obstructive sleep apnea (adult) (pediatric): Secondary | ICD-10-CM

## 2020-05-07 DIAGNOSIS — R300 Dysuria: Secondary | ICD-10-CM

## 2020-05-07 LAB — POCT GLYCOSYLATED HEMOGLOBIN (HGB A1C): Hemoglobin A1C: 5.9 % — AB (ref 4.0–5.6)

## 2020-05-07 NOTE — Progress Notes (Signed)
[facility] [address] [place], [place] [address]  Internal MEDICINE  Office Visit Note  Patient Name: Adrian Flores  [id number]  [id number]  Date of Service: 05/07/2020  Chief Complaint  Patient presents with  . Medicare Wellness  . Gastroesophageal Reflux  . Hypertension     HPI Pt is here for routine health maintenance examination.  He is a well appearing 54 yo male. He has a history of HTN, HLD, osa, GERD, cardiac pacemaker for sick sinus syndrome.  He had his labs drawn last week, and his renal function was slightly elevated. His glucose was also slightly elevated.  He reports he was fasting at the time of the blood draw.  He denies and complaints at this time.  He stopped smoking 29 years ago, he denies alcohol or illicit drug use.   Current Medication: Outpatient Encounter Medications as of 05/07/2020  Medication Sig  . enalapril (VASOTEC) 2.5 MG tablet Take 2.5 mg by mouth daily.  . Multiple Vitamin (MULTIVITAMIN WITH MINERALS) TABS tablet Take 1 tablet by mouth daily.  . nitroGLYCERIN (NITROSTAT) 0.3 MG SL tablet Place 0.3 mg under the tongue every 5 (five) minutes as needed for chest pain.  . pantoprazole (PROTONIX) 20 MG tablet Take 20 mg by mouth 2 (two) times daily.  . pseudoephedrine-acetaminophen (TYLENOL SINUS) 30-500 MG TABS tablet Take 1 tablet by mouth 1 day or 1 dose.  . sotalol (BETAPACE) 80 MG tablet Take 80 mg by mouth 1 day or 1 dose.   No facility-administered encounter medications on file as of 05/07/2020.    Surgical History: Past Surgical History:  Procedure Laterality Date  . correction eyelid retraction w/ implantation upper eyelid load    . PACEMAKER INSERTION Left 03/23/2019   Procedure: INSERTION PACEMAKER DUAL CHAMBER;  Surgeon: Marcina Millard, MD;  Location: [facility] ORS;  Service: Cardiovascular;  Laterality: Left;    Medical History: Past Medical History:  Diagnosis Date  . A-fib (HCC)   . Bradycardia   . COPD  (chronic obstructive pulmonary disease) (HCC)   . GERD (gastroesophageal reflux disease)   . Hypertension   . Leaky heart valve   . Sleep apnea     Family History: Family History  Problem Relation Age of Onset  . Heart disease Mother       Review of Systems   Vital Signs: BP 116/82   Pulse 60   Temp (!) 97.4 F (36.3 C)   Resp 16   Ht 5\' 9"  (1.753 m)   Wt 203 lb 3.2 oz (92.2 kg)   SpO2 97%   BMI 30.01 kg/m    Physical Exam   LABS: Recent Results (from the past 2160 hour(s))  Comprehensive metabolic panel     Status: Abnormal   Collection Time: 05/01/20  8:36 AM  Result Value Ref Range   Sodium 137 135 - 145 mmol/L   Potassium 4.0 3.5 - 5.1 mmol/L   Chloride 101 98 - 111 mmol/L   CO2 28 22 - 32 mmol/L   Glucose, Bld 116 (H) 70 - 99 mg/dL    Comment: Glucose reference range applies only to samples taken after fasting for at least 8 hours.   BUN 19 6 - 20 mg/dL   Creatinine, Ser 07/01/20 (H) 0.61 - 1.24 mg/dL   Calcium 8.9 8.9 - 4.94 mg/dL   Total Protein 7.8 6.5 - 8.1 g/dL   Albumin 4.2 3.5 - 5.0 g/dL   AST 35 15 - 41 U/L  ALT 32 0 - 44 U/L   Alkaline Phosphatase 85 38 - 126 U/L   Total Bilirubin 1.6 (H) 0.3 - 1.2 mg/dL   GFR calc non Af Amer >60 >60 mL/min   GFR calc Af Amer >60 >60 mL/min   Anion gap 8 5 - 15    Comment: Performed at [facility], [address] [place], [place] [address]  Lipid panel     Status: None   Collection Time: 05/01/20  8:36 AM  Result Value Ref Range   Cholesterol 139 0 - 200 mg/dL   Triglycerides 58 <852 mg/dL   HDL 41 >77 mg/dL   Total CHOL/HDL Ratio 3.4 RATIO   VLDL 12 0 - 40 mg/dL   LDL Cholesterol 86 0 - 99 mg/dL    Comment:        Total Cholesterol/HDL:CHD Risk Coronary Heart Disease Risk Table                     Men   Women  1/2 Average Risk   3.4   3.3  Average Risk       5.0   4.4  2 X Average Risk   9.6   7.1  3 X Average Risk  23.4   11.0        Use the calculated Patient Ratio above and the  CHD Risk Table to determine the patient's CHD Risk.        ATP III CLASSIFICATION (LDL):  <100     mg/dL   Optimal  824-235  mg/dL   Near or Above                    Optimal  130-159  mg/dL   Borderline  361-443  mg/dL   High  >154     mg/dL   Very High Performed at [facility], [address] [place], [place] [address]   CBC with Differential/Platelet     Status: Abnormal   Collection Time: 05/01/20  8:36 AM  Result Value Ref Range   WBC 6.4 4.0 - 10.5 K/uL   RBC 5.16 4.22 - 5.81 MIL/uL   Hemoglobin 13.3 13.0 - 17.0 g/dL   HCT 61.9 50.9 - 32.6 %   MCV 81.4 80.0 - 100.0 fL   MCH 25.8 (L) 26.0 - 34.0 pg   MCHC 31.7 30.0 - 36.0 g/dL   RDW 71.2 45.8 - 09.9 %   Platelets 270 150 - 400 K/uL   nRBC 0.0 0.0 - 0.2 %   Neutrophils Relative % 59 %   Neutro Abs 3.8 1.7 - 7.7 K/uL   Lymphocytes Relative 26 %   Lymphs Abs 1.6 0.7 - 4.0 K/uL   Monocytes Relative 12 %   Monocytes Absolute 0.8 0.1 - 1.0 K/uL   Eosinophils Relative 2 %   Eosinophils Absolute 0.1 0.0 - 0.5 K/uL   Basophils Relative 1 %   Basophils Absolute 0.1 0.0 - 0.1 K/uL   Immature Granulocytes 0 %   Abs Immature Granulocytes 0.02 0.00 - 0.07 K/uL    Comment: Performed at [facility], [address] [place], [place] [address]  PSA     Status: None   Collection Time: 05/01/20  8:36 AM  Result Value Ref Range   Prostatic Specific Antigen 0.63 0.00 - 4.00 ng/mL    Comment: (NOTE) While PSA levels of <=4.0 ng/ml are reported as reference range, some men with levels below 4.0 ng/ml  can have prostate cancer and many men with PSA above 4.0 ng/ml do not have prostate cancer.  Other tests such as free PSA, age specific reference ranges, PSA velocity and PSA doubling time may be helpful especially in men less than 79 years old. Performed at [facility], [employer] [address], [place], [place] [address]   T4, free     Status: None   Collection Time: 05/01/20  8:36 AM  Result Value Ref Range    Free T4 0.73 0.61 - 1.12 ng/dL    Comment: (NOTE) Biotin ingestion may interfere with free T4 tests. If the results are inconsistent with the TSH level, previous test results, or the clinical presentation, then consider biotin interference. If needed, order repeat testing after stopping biotin. Performed at [facility] [address] [place], [place] [address]   TSH     Status: Abnormal   Collection Time: 05/01/20  8:36 AM  Result Value Ref Range   TSH 4.502 (H) 0.350 - 4.500 uIU/mL    Comment: Performed by a 3rd Generation assay with a functional sensitivity of <=0.01 uIU/mL. Performed at [facility], [place], [place] [address]   POCT HgB A1C     Status: Abnormal   Collection Time: 05/07/20 10:28 AM  Result Value Ref Range   Hemoglobin A1C 5.9 (A) 4.0 - 5.6 %   HbA1c POC (<> result, manual entry)     HbA1c, POC (prediabetic range)     HbA1c, POC (controlled diabetic range)      Assessment/Plan: 1. Encounter for general adult medical examination with abnormal findings Up to date on PHM  2. Prediabetes Discussed pre-DM, and lifestyle changes including limiting carbohydrates and complex sugars.  - POCT HgB A1C  3. Elevated serum creatinine Renal function slightly elevated, encouraged patient to drink water, and will recheck level at next visit.   4. Gastroesophageal reflux disease without esophagitis Stable, continue present management.   5. OSA on CPAP Continue to use Cpap nightly as directed.   6. Atrial fibrillation, unspecified type ([place]) Continue to follow up with cardiology for management.   7. Pacemaker  initially for sick sinus syndrome, continue to follow up with cardiology for management.   8. BMI 30.0-30.9,adult Obesity Counseling: Risk Assessment: An assessment of behavioral risk factors was made today and includes lack of exercise sedentary lifestyle, lack of portion control and poor dietary habits.  Risk  Modification Advice: She was counseled on portion control guidelines. Restricting daily caloric intake to *1800. The detrimental long term effects of obesity on her health and ongoing poor compliance was also discussed with the patient.  9. Dysuria - Urinalysis, Routine w reflex microscopic  General Counseling: Corban verbalizes understanding of the findings of todays visit and agrees with plan of treatment. I have discussed any further diagnostic evaluation that may be needed or ordered today. We also reviewed his medications today. he has been encouraged to call the office with any questions or concerns that should arise related to todays visit.   Orders Placed This Encounter  Procedures  . Urinalysis, Routine w reflex microscopic  . POCT HgB A1C    No orders of the defined types were placed in this encounter.   Time spent: 30 Minutes   This patient was seen by Orson Gear AGNP-C in Collaboration with Dr Lavera Guise as a part of collaborative care agreement    Kendell Bane AGNP-C Internal Medicine

## 2020-05-08 LAB — URINALYSIS, ROUTINE W REFLEX MICROSCOPIC
Bilirubin, UA: NEGATIVE
Glucose, UA: NEGATIVE
Ketones, UA: NEGATIVE
Leukocytes,UA: NEGATIVE
Nitrite, UA: NEGATIVE
Protein,UA: NEGATIVE
RBC, UA: NEGATIVE
Specific Gravity, UA: 1.025 (ref 1.005–1.030)
Urobilinogen, Ur: 0.2 mg/dL (ref 0.2–1.0)
pH, UA: 6.5 (ref 5.0–7.5)

## 2020-11-20 DIAGNOSIS — U071 COVID-19: Secondary | ICD-10-CM

## 2020-11-20 HISTORY — DX: COVID-19: U07.1

## 2020-12-18 ENCOUNTER — Encounter: Payer: Self-pay | Admitting: Emergency Medicine

## 2020-12-18 ENCOUNTER — Emergency Department: Payer: Medicaid Other

## 2020-12-18 ENCOUNTER — Emergency Department
Admission: EM | Admit: 2020-12-18 | Discharge: 2020-12-18 | Disposition: A | Discharge status: Home / Self Care | Payer: Medicaid Other | Attending: Emergency Medicine | Admitting: Emergency Medicine

## 2020-12-18 ENCOUNTER — Telehealth: Payer: Self-pay

## 2020-12-18 ENCOUNTER — Other Ambulatory Visit: Payer: Self-pay

## 2020-12-18 DIAGNOSIS — Z950 Presence of cardiac pacemaker: Secondary | ICD-10-CM | POA: Diagnosis not present

## 2020-12-18 DIAGNOSIS — U071 COVID-19: Secondary | ICD-10-CM | POA: Insufficient documentation

## 2020-12-18 DIAGNOSIS — J449 Chronic obstructive pulmonary disease, unspecified: Secondary | ICD-10-CM | POA: Insufficient documentation

## 2020-12-18 DIAGNOSIS — Z87891 Personal history of nicotine dependence: Secondary | ICD-10-CM | POA: Insufficient documentation

## 2020-12-18 DIAGNOSIS — I10 Essential (primary) hypertension: Secondary | ICD-10-CM | POA: Diagnosis not present

## 2020-12-18 DIAGNOSIS — R059 Cough, unspecified: Secondary | ICD-10-CM | POA: Diagnosis present

## 2020-12-18 LAB — POC SARS CORONAVIRUS 2 AG -  ED: SARS Coronavirus 2 Ag: POSITIVE — AB

## 2020-12-18 MED ORDER — METHYLPREDNISOLONE 4 MG PO TBPK
ORAL_TABLET | ORAL | 0 refills | Status: DC
Start: 2020-12-18 — End: 2023-07-05

## 2020-12-18 NOTE — ED Provider Notes (Signed)
[facility] Emergency Department Provider Note  ____________________________________________   Event Date/Time   First MD Initiated Contact with Patient 12/18/20 1145     (approximate)  I have reviewed the triage vital signs and the nursing notes.   HISTORY  Chief Complaint URI and Fever    HPI Adrian Flores is a 54 y.o. male presents emergency department Covid-like symptoms. States he traveled to [place] and just returned home. States symptoms for 2 days. States that a productive cough and he does feel some shortness of breath. Patient has history of A. fib and a pacemaker. He denies chest pain. No vomiting or diarrhea. Patient is not vaccinated    Past Medical History:  Diagnosis Date  . A-fib (HCC)   . Bradycardia   . COPD (chronic obstructive pulmonary disease) (HCC)   . GERD (gastroesophageal reflux disease)   . Hypertension   . Leaky heart valve   . Sleep apnea     Patient Active Problem List   Diagnosis Date Noted  . Left ear impacted cerumen 06/26/2019  . Encounter for general adult medical examination with abnormal findings 05/07/2019  . CPAP (continuous positive airway pressure) dependence 05/07/2019  . Dysuria 05/07/2019  . Pacemaker 03/30/2019  . Sick sinus syndrome (HCC) 03/23/2019  . A-fib (HCC) 03/01/2019  . COPD (chronic obstructive pulmonary disease) (HCC) 03/01/2019  . Essential hypertension 03/01/2019  . Sick sinus syndrome (HCC) 03/01/2019    Past Surgical History:  Procedure Laterality Date  . correction eyelid retraction w/ implantation upper eyelid load    . PACEMAKER INSERTION Left 03/23/2019   Procedure: INSERTION PACEMAKER DUAL CHAMBER;  Surgeon: Marcina Millard, MD;  Location: [facility] ORS;  Service: Cardiovascular;  Laterality: Left;    Prior to Admission medications   Medication Sig Start Date End Date Taking? Authorizing Provider  methylPREDNISolone (MEDROL DOSEPAK) 4 MG TBPK tablet Take 6 pills  on day one then decrease by 1 pill each day 12/18/20  Yes Audia Amick, Roselyn Bering, PA-C  enalapril (VASOTEC) 2.5 MG tablet Take 2.5 mg by mouth daily.    [provider]  Multiple Vitamin (MULTIVITAMIN WITH MINERALS) TABS tablet Take 1 tablet by mouth daily.    [provider]  nitroGLYCERIN (NITROSTAT) 0.3 MG SL tablet Place 0.3 mg under the tongue every 5 (five) minutes as needed for chest pain.    [provider]  pantoprazole (PROTONIX) 20 MG tablet Take 20 mg by mouth 2 (two) times daily.    [provider]  pseudoephedrine-acetaminophen (TYLENOL SINUS) 30-500 MG TABS tablet Take 1 tablet by mouth 1 day or 1 dose.    [provider]  sotalol (BETAPACE) 80 MG tablet Take 80 mg by mouth 1 day or 1 dose.    [provider]    Allergies Patient has no known allergies.  Family History  Problem Relation Age of Onset  . Heart disease Mother     Social History Social History   Tobacco Use  . Smoking status: Former Games developer  . Smokeless tobacco: Never Used  Vaping Use  . Vaping Use: Never used  Substance Use Topics  . Alcohol use: No  . Drug use: Not Currently    Comment: teenager    Review of Systems  Constitutional: Positive fever/chills Eyes: No visual changes. ENT: No sore throat. Respiratory: Positive cough Cardiovascular: Denies chest pain Gastrointestinal: Denies abdominal pain Genitourinary: Negative for dysuria. Musculoskeletal: Negative for back pain. Skin: Negative for rash. Psychiatric: no mood changes,  ____________________________________________   PHYSICAL EXAM:  VITAL SIGNS: ED Triage Vitals  Enc Vitals Group     BP 12/18/20 1033 112/78     Pulse Rate 12/18/20 1033 (!) 59     Resp 12/18/20 1033 20     Temp 12/18/20 1033 99.4 F (37.4 C)     Temp Source 12/18/20 1033 Oral     SpO2 12/18/20 1033 97 %     Weight 12/18/20 1033 198 lb (89.8 kg)     Height 12/18/20 1033 5\' 9"  (1.753 m)     Head  Circumference --      Peak Flow --      Pain Score 12/18/20 1052 4     Pain Loc --      Pain Edu? --      Excl. in GC? --     Constitutional: Alert and oriented. Well appearing and in no acute distress. Eyes: Conjunctivae are normal.  Head: Atraumatic. Nose: No congestion/rhinnorhea. Mouth/Throat: Mucous membranes are moist.   Neck:  supple no lymphadenopathy noted Cardiovascular: Normal rate, regular rhythm. Heart sounds are normal Respiratory: Normal respiratory effort.  No retractions, lungs c t a  GU: deferred Musculoskeletal: FROM all extremities, warm and well perfused Neurologic:  Normal speech and language.  Skin:  Skin is warm, dry and intact. No rash noted. Psychiatric: Mood and affect are normal. Speech and behavior are normal.  ____________________________________________   LABS (all labs ordered are listed, but only abnormal results are displayed)  Labs Reviewed  POC SARS CORONAVIRUS 2 AG -  ED - Abnormal; Notable for the following components:      Result Value   SARS Coronavirus 2 Ag Positive (*)    All other components within normal limits   ____________________________________________   ____________________________________________  RADIOLOGY  Chest x-ray  ____________________________________________   PROCEDURES  Procedure(s) performed: No  Procedures    ____________________________________________   INITIAL IMPRESSION / ASSESSMENT AND PLAN / ED COURSE  Pertinent labs & imaging results that were available during my care of the patient were reviewed by me and considered in my medical decision making (see chart for details).   Patient is 54 year old male presents with cough and Covid symptoms. See HPI. Physical exam shows patient to appear stable.  POC Covid test and chest x-ray ordered   POC Covid test positive, chest x-ray reviewed by me and confirmed by radiologist as normal  Did explain findings to the patient.  He is to quarantine  for 10 days.  Follow-up with his regular doctor as needed.  Return emergency department worsening.  Take over-the-counter vitamins and Mucinex.  He was given a prescription for medrol Dosepak.  Discharged in stable condition    Adrian Flores was evaluated in Emergency Department on 12/18/2020 for the symptoms described in the history of present illness. He was evaluated in the context of the global COVID-19 pandemic, which necessitated consideration that the patient might be at risk for infection with the SARS-CoV-2 virus that causes COVID-19. Institutional protocols and algorithms that pertain to the evaluation of patients at risk for COVID-19 are in a state of rapid change based on information released by regulatory bodies including the CDC and federal and state organizations. These policies and algorithms were followed during the patient's care in the ED.    As part of my medical decision making, I reviewed the following data within the electronic MEDICAL RECORD NUMBER Nursing notes reviewed and incorporated, Labs reviewed , Old chart reviewed, Radiograph reviewed , Notes  from prior ED visits and [place] Controlled Substance Database  ____________________________________________   FINAL CLINICAL IMPRESSION(S) / ED DIAGNOSES  Final diagnoses:  COVID-19      NEW MEDICATIONS STARTED DURING THIS VISIT:  New Prescriptions   METHYLPREDNISOLONE (MEDROL DOSEPAK) 4 MG TBPK TABLET    Take 6 pills on day one then decrease by 1 pill each day     Note:  This document was prepared using Dragon voice recognition software and may include unintentional dictation errors.    Faythe Ghee, PA-C 12/18/20 1315    Minna Antis, MD 12/18/20 [phone]

## 2020-12-18 NOTE — Telephone Encounter (Signed)
Patient called requesting an appt/meds for his upper respiratory infection/fever (102). Pt was advised to get tested for covid & flu/tat

## 2020-12-18 NOTE — ED Triage Notes (Signed)
Pt report fever and upper resp symtpoms for the last couple of days. Pt reports cough that is on productive as well

## 2020-12-18 NOTE — Discharge Instructions (Addendum)
Take over-the-counter vitamin C, vitamin D, and zinc to help boost your immune system Take over-the-counter Mucinex for your cough Take the steroid pack to decrease inflammation in your lungs Return emergency department if worsening You need to quarantine for 10 days

## 2020-12-19 ENCOUNTER — Encounter: Payer: Self-pay | Admitting: Nurse Practitioner

## 2020-12-19 ENCOUNTER — Telehealth: Payer: Self-pay

## 2020-12-19 ENCOUNTER — Other Ambulatory Visit: Payer: Self-pay

## 2020-12-19 ENCOUNTER — Telehealth: Payer: Self-pay | Admitting: Nurse Practitioner

## 2020-12-19 DIAGNOSIS — E663 Overweight: Secondary | ICD-10-CM | POA: Insufficient documentation

## 2020-12-19 DIAGNOSIS — I10 Essential (primary) hypertension: Secondary | ICD-10-CM | POA: Insufficient documentation

## 2020-12-19 MED ORDER — AZITHROMYCIN 250 MG PO TABS
ORAL_TABLET | ORAL | 0 refills | Status: DC
Start: 2020-12-19 — End: 2023-02-18

## 2020-12-19 NOTE — Telephone Encounter (Signed)
12/18/20 (Epic went down wasn't able to document ) pt called and advised he went to Er on 12/18/20 and tested positive for Covid.  Pt having a cough, congested, fever and was given prednisone from ER.  Pt's symptoms started on Monday 12/16/20.  Per taylor I sent in z-pak to [facility] [place].  Pt was informed I sent meds to pharmacy

## 2020-12-19 NOTE — Telephone Encounter (Signed)
I called Essie Christine Pontarelli to discuss Covid symptoms and the use of a monoclonal antibody infusion for those with mild to moderate Covid symptoms and at a high risk of hospitalization.  Pts wife answered and would not put him on the phone, but stated clearly that "he don't want the shot," and further that he would not be interested in mAb.  Last date pt would be eligible for infusion is 1.3.2022.      Patient Active Problem List   Diagnosis Date Noted  . Overweight (BMI 25.0-29.9)   . Hypertension   . COVID-19 virus infection 11/2020  . Left ear impacted cerumen 06/26/2019  . Encounter for general adult medical examination with abnormal findings 05/07/2019  . CPAP (continuous positive airway pressure) dependence 05/07/2019  . Dysuria 05/07/2019  . Pacemaker 03/30/2019  . Sick sinus syndrome (HCC) 03/23/2019  . A-fib (HCC) 03/01/2019  . COPD (chronic obstructive pulmonary disease) (HCC) 03/01/2019  . Essential hypertension 03/01/2019  . Sick sinus syndrome (HCC) 03/01/2019    Nicolasa Ducking, NP

## 2021-05-07 ENCOUNTER — Encounter: Payer: Medicaid Other | Admitting: Nurse Practitioner

## 2021-05-15 ENCOUNTER — Encounter: Payer: Medicaid Other | Admitting: Nurse Practitioner

## 2021-05-28 ENCOUNTER — Telehealth: Payer: Self-pay

## 2021-05-28 NOTE — Telephone Encounter (Signed)
Patient was mailed a discharge letter for non compliancy. Patient was verbally advised to keep last scheduled appointment and no showed. Copy was placed in scan. Toni Amend

## 2021-09-05 ENCOUNTER — Emergency Department
Admission: EM | Admit: 2021-09-05 | Discharge: 2021-09-05 | Disposition: A | Discharge status: Home / Self Care | Payer: Medicaid Other | Attending: Emergency Medicine | Admitting: Emergency Medicine

## 2021-09-05 ENCOUNTER — Other Ambulatory Visit: Payer: Self-pay

## 2021-09-05 ENCOUNTER — Encounter: Payer: Self-pay | Admitting: Emergency Medicine

## 2021-09-05 ENCOUNTER — Emergency Department: Payer: Medicaid Other

## 2021-09-05 DIAGNOSIS — Z87891 Personal history of nicotine dependence: Secondary | ICD-10-CM | POA: Diagnosis not present

## 2021-09-05 DIAGNOSIS — J449 Chronic obstructive pulmonary disease, unspecified: Secondary | ICD-10-CM | POA: Diagnosis not present

## 2021-09-05 DIAGNOSIS — Z79899 Other long term (current) drug therapy: Secondary | ICD-10-CM | POA: Insufficient documentation

## 2021-09-05 DIAGNOSIS — I10 Essential (primary) hypertension: Secondary | ICD-10-CM | POA: Diagnosis not present

## 2021-09-05 DIAGNOSIS — Z8616 Personal history of COVID-19: Secondary | ICD-10-CM | POA: Insufficient documentation

## 2021-09-05 DIAGNOSIS — M545 Low back pain, unspecified: Secondary | ICD-10-CM | POA: Diagnosis not present

## 2021-09-05 DIAGNOSIS — Z950 Presence of cardiac pacemaker: Secondary | ICD-10-CM | POA: Insufficient documentation

## 2021-09-05 MED ORDER — KETOROLAC TROMETHAMINE 30 MG/ML IJ SOLN
30.0000 mg | Freq: Once | INTRAMUSCULAR | Status: AC
  Administered 2021-09-05: 30 mg via INTRAVENOUS
  Filled 2021-09-05: qty 1

## 2021-09-05 MED ORDER — NAPROXEN 500 MG PO TABS
500.0000 mg | ORAL_TABLET | Freq: Two times a day (BID) | ORAL | Status: DC
Start: 2021-09-05 — End: 2023-07-05

## 2021-09-05 MED ORDER — OXYCODONE-ACETAMINOPHEN 7.5-325 MG PO TABS: 1.0000 | ORAL_TABLET | Freq: Four times a day (QID) | ORAL | 0 refills | Status: DC | PRN

## 2021-09-05 MED ORDER — HYDROMORPHONE HCL 1 MG/ML IJ SOLN
1.0000 mg | Freq: Once | INTRAMUSCULAR | Status: AC
  Administered 2021-09-05: 1 mg via INTRAMUSCULAR
  Filled 2021-09-05: qty 1

## 2021-09-05 MED ORDER — METHOCARBAMOL 750 MG PO TABS
750.0000 mg | ORAL_TABLET | Freq: Four times a day (QID) | ORAL | 0 refills | Status: DC
Start: 2021-09-05 — End: 2023-11-11

## 2021-09-05 MED ORDER — ORPHENADRINE CITRATE 30 MG/ML IJ SOLN
30.0000 mg | Freq: Two times a day (BID) | INTRAMUSCULAR | Status: DC
  Administered 2021-09-05: 30 mg via INTRAMUSCULAR
  Filled 2021-09-05: qty 2

## 2021-09-05 NOTE — ED Provider Notes (Signed)
[facility] Emergency Department Provider Note   ____________________________________________   Event Date/Time   First MD Initiated Contact with Patient 09/05/21 [phone]     (approximate)  I have reviewed the triage vital signs and the nursing notes.   HISTORY  Chief Complaint Back Pain    HPI Adrian Flores is a 55 y.o. male patient complaining right low back pain x1 week.  Incident occurred status post lifting garbage cans onto a trailer 1 week ago.  Denies radicular component to back pain.  Denies bladder or bowel dysfunction.  Rates pain as a 7/10.  Described pain as "achy".  No palliative measure for complaint.  Patient has pacemaker secondary to A. Fib and sick sinus syndrome.         Past Medical History:  Diagnosis Date   A-fib (HCC)    Bradycardia    COPD (chronic obstructive pulmonary disease) (HCC)    COVID-19 virus infection 11/2020   GERD (gastroesophageal reflux disease)    Hypertension    Leaky heart valve    Overweight (BMI 25.0-29.9)    Sleep apnea     Patient Active Problem List   Diagnosis Date Noted   Overweight (BMI 25.0-29.9)    Hypertension    COVID-19 virus infection 11/2020   Left ear impacted cerumen 06/26/2019   Encounter for general adult medical examination with abnormal findings 05/07/2019   CPAP (continuous positive airway pressure) dependence 05/07/2019   Dysuria 05/07/2019   Pacemaker 03/30/2019   Sick sinus syndrome (HCC) 03/23/2019   A-fib (HCC) 03/01/2019   COPD (chronic obstructive pulmonary disease) (HCC) 03/01/2019   Essential hypertension 03/01/2019   Sick sinus syndrome (HCC) 03/01/2019    Past Surgical History:  Procedure Laterality Date   correction eyelid retraction w/ implantation upper eyelid load     PACEMAKER INSERTION Left 03/23/2019   Procedure: INSERTION PACEMAKER DUAL CHAMBER;  Surgeon: Marcina Millard, MD;  Location: [facility] ORS;  Service: Cardiovascular;  Laterality: Left;     Prior to Admission medications   Medication Sig Start Date End Date Taking? Authorizing Provider  methocarbamol (ROBAXIN-750) 750 MG tablet Take 1 tablet (750 mg total) by mouth 4 (four) times daily. 09/05/21  Yes Joni Reining, PA-C  naproxen (NAPROSYN) 500 MG tablet Take 1 tablet (500 mg total) by mouth 2 (two) times daily with a meal. 09/05/21  Yes Joni Reining, PA-C  oxyCODONE-acetaminophen (PERCOCET) 7.5-325 MG tablet Take 1 tablet by mouth every 6 (six) hours as needed for severe pain. 09/05/21  Yes Joni Reining, PA-C  azithromycin (ZITHROMAX) 250 MG tablet Take 2 tablets first day and then 1 tablet each day for 4 days 12/19/20   Theotis Burrow, NP  enalapril (VASOTEC) 2.5 MG tablet Take 2.5 mg by mouth daily.    [provider]  methylPREDNISolone (MEDROL DOSEPAK) 4 MG TBPK tablet Take 6 pills on day one then decrease by 1 pill each day 12/18/20   Faythe Ghee, PA-C  Multiple Vitamin (MULTIVITAMIN WITH MINERALS) TABS tablet Take 1 tablet by mouth daily.    [provider]  nitroGLYCERIN (NITROSTAT) 0.3 MG SL tablet Place 0.3 mg under the tongue every 5 (five) minutes as needed for chest pain.    [provider]  pantoprazole (PROTONIX) 20 MG tablet Take 20 mg by mouth 2 (two) times daily.    [provider]  pseudoephedrine-acetaminophen (TYLENOL SINUS) 30-500 MG TABS tablet Take 1 tablet by mouth 1 day or 1 dose.  [provider]  sotalol (BETAPACE) 80 MG tablet Take 80 mg by mouth 1 day or 1 dose.    [provider]    Allergies Patient has no known allergies.  Family History  Problem Relation Age of Onset   Heart disease Mother     Social History Social History   Tobacco Use   Smoking status: Former   Smokeless tobacco: Never  Building services engineer Use: Never used  Substance Use Topics   Alcohol use: No   Drug use: Not Currently    Comment: teenager    Review of Systems  Constitutional: No  fever/chills Eyes: No visual changes. ENT: No sore throat. Cardiovascular: Denies chest pain.  History of A. fib and sick sinus syndrome. Respiratory: Denies shortness of breath. Gastrointestinal: No abdominal pain.  No nausea, no vomiting.  No diarrhea.  No constipation. Genitourinary: Negative for dysuria. Musculoskeletal: Positive for back pain. Skin: Negative for rash. Neurological: Negative for headaches, focal weakness or numbness. Psychiatric:  Endocrine: Hypertension  ____________________________________________   PHYSICAL EXAM:  VITAL SIGNS: ED Triage Vitals  Enc Vitals Group     BP 09/05/21 0851 108/73     Pulse Rate 09/05/21 0851 (!) 59     Resp 09/05/21 0851 16     Temp 09/05/21 0851 97.6 F (36.4 C)     Temp Source 09/05/21 0851 Oral     SpO2 09/05/21 0851 97 %     Weight 09/05/21 0851 198 lb 6.6 oz (90 kg)     Height 09/05/21 0851 5\' 9"  (1.753 m)     Head Circumference --      Peak Flow --      Pain Score 09/05/21 0850 7     Pain Loc --      Pain Edu? --      Excl. in GC? --     Constitutional: Alert and oriented. Well appearing and in no acute distress. Eyes: Conjunctivae are normal. PERRL. EOMI. Head: Atraumatic. Nose: No congestion/rhinnorhea. Mouth/Throat: Mucous membranes are moist.  Oropharynx non-erythematous. Neck: No stridor.   Hematological/Lymphatic/Immunilogical: No cervical lymphadenopathy. **}Cardiovascular: Bradycardic, regular rhythm. Grossly normal heart sounds.  Good peripheral circulation. Respiratory: Normal respiratory effort.  No retractions. Lungs CTAB. Gastrointestinal: Soft and nontender. No distention. No abdominal bruits. No CVA tenderness. Genitourinary: Deferred Musculoskeletal: No lower extremity tenderness nor edema.  No joint effusions. Neurologic:  Normal speech and language. No gross focal neurologic deficits are appreciated. No gait instability. Skin:  Skin is warm, dry and intact. No rash noted. Psychiatric: Mood  and affect are normal. Speech and behavior are normal.  ____________________________________________   LABS (all labs ordered are listed, but only abnormal results are displayed)  Labs Reviewed - No data to display ____________________________________________  EKG   ____________________________________________  RADIOLOGY I, 09/07/21, personally viewed and evaluated these images (plain radiographs) as part of my medical decision making, as well as reviewing the written report by the radiologist.  ED MD interpretation: Degenerative changes of the lumbar spine. Official radiology report(s): DG Lumbar Spine 2-3 Views  Result Date: 09/05/2021 CLINICAL DATA:  Lower back pain after lifting incident EXAM: LUMBAR SPINE - 2-3 VIEW COMPARISON:  Lumbar spine radiographs 09/09/2016 FINDINGS: There is transitional anatomy, for the purposes of this report, the last disc space is designated L5-S1 with sacralization of the L5 vertebral body. Vertebral body heights are preserved. There is no evidence of acute fracture. Alignment is normal. There is intervertebral disc space narrowing at L5-S1  with facet arthropathy at L4-L5 and L5-S1. IMPRESSION: 1. No acute findings in the lumbar spine. 2. Degenerative changes at L4-L5 and L5-S1 as above. Electronically Signed   By: Lesia Hausen M.D.   On: 09/05/2021 10:31    ____________________________________________   PROCEDURES  Procedure(s) performed (including Critical Care):  Procedures   ____________________________________________   INITIAL IMPRESSION / ASSESSMENT AND PLAN / ED COURSE  As part of my medical decision making, I reviewed the following data within the electronic MEDICAL RECORD NUMBER         Patient presents with 1 week history of back pain secondary to lifting incident.  Discussed x-ray findings with patient.  Patient complaint physical exam consistent with low back pain.  Patient given discharge care instructions and advised take  medication as directed.  Follow-up with PCP.      ____________________________________________   FINAL CLINICAL IMPRESSION(S) / ED DIAGNOSES  Final diagnoses:  Acute low back pain without sciatica, unspecified back pain laterality     ED Discharge Orders          Ordered    oxyCODONE-acetaminophen (PERCOCET) 7.5-325 MG tablet  Every 6 hours PRN        09/05/21 1117    methocarbamol (ROBAXIN-750) 750 MG tablet  4 times daily        09/05/21 1117    naproxen (NAPROSYN) 500 MG tablet  2 times daily with meals        09/05/21 1117             Note:  This document was prepared using Dragon voice recognition software and may include unintentional dictation errors.    Joni Reining, PA-C 09/05/21 1126    Jene Every, MD 09/05/21 1135

## 2021-09-05 NOTE — Discharge Instructions (Signed)
No acute findings on x-ray of the lumbar spine.  Moderate degenerative changes are appreciated.  Read and follow discharge care instruction take medication as directed.

## 2021-09-05 NOTE — ED Triage Notes (Signed)
C/O right lower back pain x 1 week. States injured back while lifting garbage cans onto trailer.

## 2021-09-05 NOTE — ED Notes (Signed)
Patient transported to X-ray 

## 2023-02-14 ENCOUNTER — Other Ambulatory Visit: Payer: Self-pay | Admitting: Cardiovascular Disease

## 2023-02-14 DIAGNOSIS — I4891 Unspecified atrial fibrillation: Secondary | ICD-10-CM

## 2023-03-02 ENCOUNTER — Encounter: Payer: Self-pay | Admitting: Cardiovascular Disease

## 2023-03-02 ENCOUNTER — Ambulatory Visit (INDEPENDENT_AMBULATORY_CARE_PROVIDER_SITE_OTHER): Payer: Medicaid Other | Admitting: Cardiovascular Disease

## 2023-03-02 VITALS — BP 124/70 | HR 49 | Ht 69.0 in | Wt 194.8 lb

## 2023-03-02 DIAGNOSIS — I10 Essential (primary) hypertension: Secondary | ICD-10-CM

## 2023-03-02 DIAGNOSIS — I4891 Unspecified atrial fibrillation: Secondary | ICD-10-CM | POA: Diagnosis not present

## 2023-03-02 DIAGNOSIS — Z950 Presence of cardiac pacemaker: Secondary | ICD-10-CM | POA: Diagnosis not present

## 2023-03-02 DIAGNOSIS — I495 Sick sinus syndrome: Secondary | ICD-10-CM

## 2023-03-02 NOTE — Assessment & Plan Note (Signed)
MDT PPM check today. No arrhythmias noted. Battery life 10.0 years.

## 2023-03-02 NOTE — Assessment & Plan Note (Signed)
Well-controlled on current medications 

## 2023-03-02 NOTE — Progress Notes (Signed)
Cardiology Office Note   Date:  03/02/2023   ID:  Adrian Flores, DOB 09-29-1966, MRN [id number]  PCP:  Pcp, No  Cardiologist:  Neoma Laming, MD      History of Present Illness: Adrian Flores is a 56 y.o. male who presents for  Chief Complaint  Patient presents with   Follow-up    4 month follow up    Patient in office for routine cardiac exam. Denies chest pain, shortness of breath, edema, palpitations. PPM check today.      Past Medical History:  Diagnosis Date   A-fib ([place])    Bradycardia    COPD (chronic obstructive pulmonary disease) ([place])    COVID-19 virus infection 11/2020   GERD (gastroesophageal reflux disease)    Hypertension    Leaky heart valve    Overweight (BMI 25.0-29.9)    Sleep apnea      Past Surgical History:  Procedure Laterality Date   correction eyelid retraction w/ implantation upper eyelid load     PACEMAKER INSERTION Left 03/23/2019   Procedure: INSERTION PACEMAKER DUAL CHAMBER;  Surgeon: Isaias Cowman, MD;  Location: [facility] ORS;  Service: Cardiovascular;  Laterality: Left;     Current Outpatient Medications  Medication Sig Dispense Refill   enalapril (VASOTEC) 2.5 MG tablet Take 2.5 mg by mouth daily.     methocarbamol (ROBAXIN-750) 750 MG tablet Take 1 tablet (750 mg total) by mouth 4 (four) times daily. 20 tablet 0   pantoprazole (PROTONIX) 20 MG tablet Take 20 mg by mouth 2 (two) times daily.     sotalol (BETAPACE) 80 MG tablet TAKE 1 TABLET BY MOUTH TWICE A DAY 180 tablet 1   methylPREDNISolone (MEDROL DOSEPAK) 4 MG TBPK tablet Take 6 pills on day one then decrease by 1 pill each day (Patient not taking: Reported on 03/02/2023) 21 tablet 0   Multiple Vitamin (MULTIVITAMIN WITH MINERALS) TABS tablet Take 1 tablet by mouth daily. (Patient not taking: Reported on 03/02/2023)     naproxen (NAPROSYN) 500 MG tablet Take 1 tablet (500 mg total) by mouth 2 (two) times daily with a meal. (Patient not taking: Reported on  03/02/2023) 20 tablet 00   nitroGLYCERIN (NITROSTAT) 0.3 MG SL tablet Place 0.3 mg under the tongue every 5 (five) minutes as needed for chest pain. (Patient not taking: Reported on 03/02/2023)     oxyCODONE-acetaminophen (PERCOCET) 7.5-325 MG tablet Take 1 tablet by mouth every 6 (six) hours as needed for severe pain. (Patient not taking: Reported on 03/02/2023) 12 tablet 0   pseudoephedrine-acetaminophen (TYLENOL SINUS) 30-500 MG TABS tablet Take 1 tablet by mouth 1 day or 1 dose. (Patient not taking: Reported on 03/02/2023)     No current facility-administered medications for this visit.    Allergies:   Patient has no known allergies.    Social History:   reports that he has quit smoking. He has never used smokeless tobacco. He reports that he does not currently use drugs. He reports that he does not drink alcohol.   Family History:  family history includes Heart disease in his mother.    ROS:     Review of Systems  Constitutional: Negative.   HENT: Negative.    Eyes: Negative.   Respiratory: Negative.    Cardiovascular: Negative.   Gastrointestinal: Negative.   Genitourinary: Negative.   Musculoskeletal: Negative.   Skin: Negative.   Neurological: Negative.   Endo/Heme/Allergies: Negative.   Psychiatric/Behavioral: Negative.    All other systems reviewed and  are negative.   All other systems are reviewed and negative.   PHYSICAL EXAM: VS:  BP 124/70   Pulse (!) 49   Ht '5\' 9"'$  (1.753 m)   Wt 194 lb 12.8 oz (88.4 kg)   SpO2 94%   BMI 28.77 kg/m  , BMI Body mass index is 28.77 kg/m. Last weight:  Wt Readings from Last 3 Encounters:  03/02/23 194 lb 12.8 oz (88.4 kg)  09/05/21 198 lb 6.6 oz (90 kg)  12/18/20 198 lb (89.8 kg)   Physical Exam Vitals reviewed.  Constitutional:      Appearance: Normal appearance. He is normal weight.  HENT:     Head: Normocephalic.     Nose: Nose normal.     Mouth/Throat:     Mouth: Mucous membranes are moist.  Eyes:     Pupils:  Pupils are equal, round, and reactive to light.  Cardiovascular:     Rate and Rhythm: Normal rate and regular rhythm.     Pulses: Normal pulses.     Heart sounds: Normal heart sounds.  Pulmonary:     Effort: Pulmonary effort is normal.  Abdominal:     General: Abdomen is flat. Bowel sounds are normal.  Musculoskeletal:        General: Normal range of motion.     Cervical back: Normal range of motion.  Skin:    General: Skin is warm.  Neurological:     General: No focal deficit present.     Mental Status: He is alert.  Psychiatric:        Mood and Affect: Mood normal.      EKG: none today  Recent Labs: No results found for requested labs within last 365 days.    Lipid Panel    Component Value Date/Time   CHOL 139 05/01/2020 0836   TRIG 58 05/01/2020 0836   HDL 41 05/01/2020 0836   CHOLHDL 3.4 05/01/2020 0836   VLDL 12 05/01/2020 0836   LDLCALC 86 05/01/2020 0836      Other studies Reviewed: Patient: Adrian Flores DOB:  [birth year]  Date:  11/19/2021 07:30 Provider: Neoma Laming MD Encounter: NUCLEAR STRESS TEST   Page 1 TESTS                                                                                          [facility] [address] [place], [place] [address] [phone] STUDY:  Gated Stress / Rest Myocardial Perfusion Imaging Tomographic (SPECT) Including attenuation correction Wall Motion, Left Ventricular Ejection Fraction By Gated Technique.Treadmill Stress Test. SEX: Male  WEIGHT: 200 lbs  HEIGHT: 69 in    ARMS UP: YES/NO                                                                        REFERRING PHYSICIAN: Dr.Chinelo Benn Humphrey Rolls  INDICATION FOR STUDY: A-Fib                                                                                                                                                                                                                      TECHNIQUE:  Approximately 20 minutes following the intravenous administration of 9.8 mCi of Tc-1mSestamibi after stress testing in a reclined supine position with arms above their head if able to do so, gated SPECT imaging of the heart was performed. After about a 2hr break, the patient was injected intravenously with 32.0 mCi of Tc-943mestamibi.  Approximately 45 minutes later in the same position as stress imaging SPECT rest imaging of the heart was performed.  STRESS BY:  ShNeoma LamingMD PROTOCOL:  BrDarnell Level                                                                                      MAX PRED HR: 165                     85%: 140               75%: 124                                                                                                                  RESTING BP: 128/74  RESTING HR: 64  PEAK BP: 140/74  PEAK HR: 131 (79%)  EXERCISE DURATION: 6:00                                             METS: 7.0     REASON FOR TEST TERMINATION: Fatigue                                                                                                                                  SYMPTOMS: Fatigue  [facility] TREADMILL SCORE: 6                                       RISK: Low                                                                                                                                                                                                            EKG RESULTS: NSR. 63/min. WNL. No significant ST changes at peak exercise.                                                              IMAGE QUALITY: Good  PERFUSION/WALL MOTION FINDINGS: EF = 67%. Small mild fixed apical (17) wall defect, normal wall motion.                                                                          IMPRESSION:  Equivocal stress test with normal LVEF.                                                                                                                                                                                                                                                                                        Neoma Laming, MD Stress Interpreting Physician / Nuclear Interpreting Physician  Neoma Laming MD  Electronically signed by: Neoma Laming     Date: 11/20/2021 11:07  Patient: [id number] - Adrian Flores DOB:  30-Nov-1966  Date:  08/14/2021 10:30 Provider: Neoma Laming MD Encounter: ECHO   Page 2 REASON FOR VISIT  Visit for: Echocardiogram/Palpitations  Sex:   Male      wt=  204  lbs.  BP= 104/68  Height=  69  inches.   TESTS  Imaging: Echocardiogram:  An echocardiogram in (2-d) mode was performed and in Doppler mode with color flow velocity mapping was performed. The aortic valve cusps are abnormal 1.9  cm, flow velocity 1.2   m/s, and systolic calculated mean flow gradient 3  mmHg. Mitral valve diastolic peak flow velocity E 0.4  m/s and E/A ratio 1.1. Aortic root diameter 3.2   cm. The LVOT internal diameter 1.8  cm and flow velocity was abnormal 0.9   m/s. LV systolic dimension 3.5   cm, diastolic 4.5  cm, posterior wall thickness 0.8   cm, fractional shortening 26 %, and EF 45 %. IVS thickness 1   cm. LA dimension 3.6 cm  RIGHT atrium=  15  cm2. Mitral Valve =  Ea= 10.5  DT= 394 msec. Tricuspid Valve =  TR jet V=   2   RAP= 5  RVSP=  22   mmHg. Mitral Valve has Mild Regurgitation. Aortic Valve has Trace  Regurgitation. Pulmonic Valve is Normal. Tricuspid Valve has Mild Regurgitation.   ASSESSMENT  Technically difficult study due to body habitus.  Ejection fraction-45  Left Ventricle- Normal size, Mild global hypokinesis  Left Ventricle diastolic dysfunction grade-Grade 1 relaxation abnormality  Right Ventricle- Mildly dilated  Normal right ventricular wall motion  Left Atrium-Normal size  Right Atrium-Normal size  Aortic valve-Trivial Aortic valve calcification with No stenosis, Trace regurgitation  Pulmonic Valve-No stenosis, no regurgitation  Mitral Valve-Mild regurgitation  Tricuspid Valve-Mild Regurgitation  No Pericardial effusion.   THERAPY   Referring physician: Dionisio David  Sonographer: Ruta Hinds.   Neoma Laming MD  Electronically signed by: Neoma Laming     Date: 08/21/2021 10:52  Patient: [id number] - Adrian Flores DOB:  20-Dec-1966  Date:  02/09/2019 11:00 Provider: Neoma Laming MD Encounter: ALL ANGIOGRAMS (CTA BRAIN, CAROTIDS, RENAL ARTERIES, PE)   Page 2 REASON FOR VISIT  Referred by Dr.Drena Ham Humphrey Rolls.   TESTS  Imaging: Computed Tomographic Angiography:  Cardiac multidetector CT was performed paying particular attention to the coronary arteries for the diagnosis of: Chest Pain. Diagnostic Drugs:  Administered iohexol (Omnipaque) through an antecubital vein and images from the examination were analyzed for the presence and extent of coronary artery disease, using 3D image processing software. 100 mL of non-ionic contrast (Omnipaque) was used.   TEST CONCLUSIONS  Quality of study: Fair.  1-Calcium score: 0  2-Right dominant system.  3-Normal coronaries.  Neoma Laming MD  Electronically signed by: Neoma Laming     Date: 02/13/2019 08:42   ASSESSMENT AND PLAN:    ICD-10-CM   1. Atrial fibrillation, unspecified type ([place])  I48.91     2. Essential hypertension  I10     3. Sick sinus syndrome (HCC)  I49.5     4. Pacemaker  Z95.0         Problem List Items Addressed This Visit       Cardiovascular and Mediastinum   Sick sinus syndrome ([place])   A-fib ([place]) - Primary   Essential hypertension    Well controlled on current medications.         Other   Pacemaker    MDT PPM check today. No arrhythmias noted. Battery life 10.0 years.        Disposition:   Return in about 4 months (around 07/02/2023).    Total time spent: 30 minutes  Signed,  Neoma Laming, MD  03/02/2023 10:21 AM    [facility]

## 2023-05-16 ENCOUNTER — Other Ambulatory Visit: Payer: Self-pay | Admitting: Cardiovascular Disease

## 2023-07-02 ENCOUNTER — Ambulatory Visit: Payer: Medicaid Other | Admitting: Cardiovascular Disease

## 2023-07-05 ENCOUNTER — Ambulatory Visit (INDEPENDENT_AMBULATORY_CARE_PROVIDER_SITE_OTHER): Payer: Medicaid Other | Admitting: Cardiovascular Disease

## 2023-07-05 ENCOUNTER — Encounter: Payer: Self-pay | Admitting: Cardiovascular Disease

## 2023-07-05 VITALS — BP 108/72 | HR 60 | Ht 69.0 in | Wt 197.0 lb

## 2023-07-05 DIAGNOSIS — Z950 Presence of cardiac pacemaker: Secondary | ICD-10-CM

## 2023-07-05 DIAGNOSIS — I480 Paroxysmal atrial fibrillation: Secondary | ICD-10-CM | POA: Diagnosis not present

## 2023-07-05 DIAGNOSIS — I10 Essential (primary) hypertension: Secondary | ICD-10-CM | POA: Diagnosis not present

## 2023-07-05 DIAGNOSIS — I495 Sick sinus syndrome: Secondary | ICD-10-CM

## 2023-07-05 DIAGNOSIS — E663 Overweight: Secondary | ICD-10-CM

## 2023-07-05 MED ORDER — NITROGLYCERIN 0.3 MG SL SUBL: 0.3000 mg | SUBLINGUAL_TABLET | SUBLINGUAL | 3 refills | Status: DC | PRN

## 2023-07-05 NOTE — Progress Notes (Signed)
Cardiology Office Note   Date:  07/05/2023   ID:  Adrian Flores, DOB 12-06-1966, MRN [id number]  PCP:  Pcp, No  Cardiologist:  Adrian Blackwater, MD      History of Present Illness: Adrian Flores is a 57 y.o. male who presents for  Chief Complaint  Patient presents with   Follow-up    4 Months Follow Up    DOING WELL      Past Medical History:  Diagnosis Date   A-fib (HCC)    Bradycardia    COPD (chronic obstructive pulmonary disease) (HCC)    COVID-19 virus infection 11/2020   GERD (gastroesophageal reflux disease)    Hypertension    Leaky heart valve    Overweight (BMI 25.0-29.9)    Sleep apnea      Past Surgical History:  Procedure Laterality Date   correction eyelid retraction w/ implantation upper eyelid load     PACEMAKER INSERTION Left 03/23/2019   Procedure: INSERTION PACEMAKER DUAL CHAMBER;  Surgeon: Marcina Millard, MD;  Location: [facility] ORS;  Service: Cardiovascular;  Laterality: Left;     Current Outpatient Medications  Medication Sig Dispense Refill   enalapril (VASOTEC) 2.5 MG tablet TAKE 1 TABLET BY MOUTH EVERY DAY 90 tablet 1   methocarbamol (ROBAXIN-750) 750 MG tablet Take 1 tablet (750 mg total) by mouth 4 (four) times daily. 20 tablet 0   nitroGLYCERIN (NITROSTAT) 0.3 MG SL tablet Place 1 tablet (0.3 mg total) under the tongue every 5 (five) minutes as needed for chest pain. 90 tablet 3   pantoprazole (PROTONIX) 20 MG tablet Take 20 mg by mouth 2 (two) times daily.     pseudoephedrine-acetaminophen (TYLENOL SINUS) 30-500 MG TABS tablet Take 1 tablet by mouth 1 day or 1 dose. (Patient not taking: Reported on 03/02/2023)     sotalol (BETAPACE) 80 MG tablet TAKE 1 TABLET BY MOUTH TWICE A DAY 180 tablet 1   No current facility-administered medications for this visit.    Allergies:   Patient has no known allergies.    Social History:   reports that he has quit smoking. He has never used smokeless tobacco. He reports that he  does not currently use drugs. He reports that he does not drink alcohol.   Family History:  family history includes Heart disease in his mother.    ROS:     Review of Systems  Constitutional: Negative.   HENT: Negative.    Eyes: Negative.   Respiratory: Negative.    Gastrointestinal: Negative.   Genitourinary: Negative.   Musculoskeletal: Negative.   Skin: Negative.   Neurological: Negative.   Endo/Heme/Allergies: Negative.   Psychiatric/Behavioral: Negative.    All other systems reviewed and are negative.     All other systems are reviewed and negative.    PHYSICAL EXAM: VS:  BP 108/72   Pulse 60   Ht 5\' 9"  (1.753 m)   Wt 197 lb (89.4 kg)   SpO2 100%   BMI 29.09 kg/m  , BMI Body mass index is 29.09 kg/m. Last weight:  Wt Readings from Last 3 Encounters:  07/05/23 197 lb (89.4 kg)  03/02/23 194 lb 12.8 oz (88.4 kg)  09/05/21 198 lb 6.6 oz (90 kg)     Physical Exam Vitals reviewed.  Constitutional:      Appearance: Normal appearance. He is normal weight.  HENT:     Head: Normocephalic.     Nose: Nose normal.     Mouth/Throat:  Mouth: Mucous membranes are moist.  Eyes:     Pupils: Pupils are equal, round, and reactive to light.  Cardiovascular:     Rate and Rhythm: Normal rate and regular rhythm.     Pulses: Normal pulses.     Heart sounds: Normal heart sounds.  Pulmonary:     Effort: Pulmonary effort is normal.  Abdominal:     General: Abdomen is flat. Bowel sounds are normal.  Musculoskeletal:        General: Normal range of motion.     Cervical back: Normal range of motion.  Skin:    General: Skin is warm.  Neurological:     General: No focal deficit present.     Mental Status: He is alert.  Psychiatric:        Mood and Affect: Mood normal.       EKG:   Recent Labs: No results found for requested labs within last 365 days.    Lipid Panel    Component Value Date/Time   CHOL 139 05/01/2020 0836   TRIG 58 05/01/2020 0836   HDL 41  05/01/2020 0836   CHOLHDL 3.4 05/01/2020 0836   VLDL 12 05/01/2020 0836   LDLCALC 86 05/01/2020 0836      Other studies Reviewed: Additional studies/ records that were reviewed today include:  Review of the above records demonstrates:       No data to display            ASSESSMENT AND PLAN:    ICD-10-CM   1. Paroxysmal atrial fibrillation (HCC)  I48.0 nitroGLYCERIN (NITROSTAT) 0.3 MG SL tablet   DOING WELL ON SOTOLOL.    2. Essential hypertension  I10 nitroGLYCERIN (NITROSTAT) 0.3 MG SL tablet    3. Sick sinus syndrome (HCC)  I49.5 nitroGLYCERIN (NITROSTAT) 0.3 MG SL tablet    4. Pacemaker  Z95.0 nitroGLYCERIN (NITROSTAT) 0.3 MG SL tablet    5. Overweight (BMI 25.0-29.9)  E66.3 nitroGLYCERIN (NITROSTAT) 0.3 MG SL tablet       Problem List Items Addressed This Visit       Cardiovascular and Mediastinum   Sick sinus syndrome (HCC)   Relevant Medications   nitroGLYCERIN (NITROSTAT) 0.3 MG SL tablet   A-fib (HCC) - Primary   Relevant Medications   nitroGLYCERIN (NITROSTAT) 0.3 MG SL tablet   Essential hypertension   Relevant Medications   nitroGLYCERIN (NITROSTAT) 0.3 MG SL tablet     Other   Pacemaker   Relevant Medications   nitroGLYCERIN (NITROSTAT) 0.3 MG SL tablet   Overweight (BMI 25.0-29.9)   Relevant Medications   nitroGLYCERIN (NITROSTAT) 0.3 MG SL tablet       Disposition:   Return in about 2 months (around 09/05/2023).    Total time spent: 30 minutes  Signed,  Adrian Blackwater, MD  07/05/2023 10:26 AM    [facility]

## 2023-08-19 ENCOUNTER — Other Ambulatory Visit: Payer: Self-pay | Admitting: Cardiovascular Disease

## 2023-08-19 DIAGNOSIS — I4891 Unspecified atrial fibrillation: Secondary | ICD-10-CM

## 2023-08-24 ENCOUNTER — Other Ambulatory Visit: Payer: Self-pay | Admitting: Cardiovascular Disease

## 2023-08-24 DIAGNOSIS — I4891 Unspecified atrial fibrillation: Secondary | ICD-10-CM

## 2023-10-05 ENCOUNTER — Encounter: Payer: Self-pay | Admitting: Cardiovascular Disease

## 2023-10-05 ENCOUNTER — Ambulatory Visit: Payer: Medicaid Other | Admitting: Cardiovascular Disease

## 2023-10-05 VITALS — BP 120/80 | HR 60 | Ht 69.0 in | Wt 197.2 lb

## 2023-10-05 DIAGNOSIS — Z950 Presence of cardiac pacemaker: Secondary | ICD-10-CM

## 2023-10-05 DIAGNOSIS — I495 Sick sinus syndrome: Secondary | ICD-10-CM

## 2023-10-05 DIAGNOSIS — I10 Essential (primary) hypertension: Secondary | ICD-10-CM | POA: Diagnosis not present

## 2023-10-05 DIAGNOSIS — R55 Syncope and collapse: Secondary | ICD-10-CM

## 2023-10-05 DIAGNOSIS — I480 Paroxysmal atrial fibrillation: Secondary | ICD-10-CM

## 2023-10-05 DIAGNOSIS — I4891 Unspecified atrial fibrillation: Secondary | ICD-10-CM | POA: Diagnosis not present

## 2023-10-05 NOTE — Progress Notes (Addendum)
Cardiology Office Note   Date:  10/05/2023   ID:  Adrian Flores, DOB Apr 23, 1966, MRN [id number]  PCP:  Pcp, No  Cardiologist:  Adrian Blackwater, MD      History of Present Illness: Adrian Flores is a 57 y.o. male who presents for  Chief Complaint  Patient presents with  . Follow-up    2 mo f/u    HAD EPISODE OF LIGHTHEADEDNESS WHILE DRIVING AND WAS GOING TO PASS OUT?  Chest Pain      Past Medical History:  Diagnosis Date  . A-fib (HCC)   . Bradycardia   . COPD (chronic obstructive pulmonary disease) (HCC)   . COVID-19 virus infection 11/2020  . GERD (gastroesophageal reflux disease)   . Hypertension   . Leaky heart valve   . Overweight (BMI 25.0-29.9)   . Sleep apnea      Past Surgical History:  Procedure Laterality Date  . correction eyelid retraction w/ implantation upper eyelid load    . PACEMAKER INSERTION Left 03/23/2019   Procedure: INSERTION PACEMAKER DUAL CHAMBER;  Surgeon: Marcina Millard, MD;  Location: [facility] ORS;  Service: Cardiovascular;  Laterality: Left;     Current Outpatient Medications  Medication Sig Dispense Refill  . enalapril (VASOTEC) 2.5 MG tablet TAKE 1 TABLET BY MOUTH EVERY DAY 90 tablet 1  . methocarbamol (ROBAXIN-750) 750 MG tablet Take 1 tablet (750 mg total) by mouth 4 (four) times daily. 20 tablet 0  . nitroGLYCERIN (NITROSTAT) 0.3 MG SL tablet Place 1 tablet (0.3 mg total) under the tongue every 5 (five) minutes as needed for chest pain. 90 tablet 3  . pantoprazole (PROTONIX) 40 MG tablet TAKE 1 TABLET BY MOUTH TWICE A DAY 180 tablet 2  . pseudoephedrine-acetaminophen (TYLENOL SINUS) 30-500 MG TABS tablet Take 1 tablet by mouth 1 day or 1 dose. (Patient not taking: Reported on 03/02/2023)    . sotalol (BETAPACE) 80 MG tablet TAKE 1 TABLET BY MOUTH TWICE A DAY 180 tablet 1   No current facility-administered medications for this visit.    Allergies:   Patient has no known allergies.    Social History:    reports that he has quit smoking. He has never used smokeless tobacco. He reports that he does not currently use drugs. He reports that he does not drink alcohol.   Family History:  family history includes Heart disease in his mother.    ROS:     Review of Systems  Constitutional: Negative.   HENT: Negative.    Eyes: Negative.   Respiratory: Negative.    Cardiovascular:  Positive for chest pain.  Gastrointestinal: Negative.   Genitourinary: Negative.   Musculoskeletal: Negative.   Skin: Negative.   Neurological: Negative.   Endo/Heme/Allergies: Negative.   Psychiatric/Behavioral: Negative.    All other systems reviewed and are negative.     All other systems are reviewed and negative.    PHYSICAL EXAM: VS:  BP 120/80   Pulse 60   Ht 5\' 9"  (1.753 m)   Wt 197 lb 3.2 oz (89.4 kg)   SpO2 98%   BMI 29.12 kg/m  , BMI Body mass index is 29.12 kg/m. Last weight:  Wt Readings from Last 3 Encounters:  10/05/23 197 lb 3.2 oz (89.4 kg)  07/05/23 197 lb (89.4 kg)  03/02/23 194 lb 12.8 oz (88.4 kg)     Physical Exam Vitals reviewed.  Constitutional:      Appearance: Normal appearance. He is normal weight.  HENT:  Head: Normocephalic.     Nose: Nose normal.     Mouth/Throat:     Mouth: Mucous membranes are moist.  Eyes:     Pupils: Pupils are equal, round, and reactive to light.  Cardiovascular:     Rate and Rhythm: Normal rate and regular rhythm.     Pulses: Normal pulses.     Heart sounds: Normal heart sounds.  Pulmonary:     Effort: Pulmonary effort is normal.  Abdominal:     General: Abdomen is flat. Bowel sounds are normal.  Musculoskeletal:        General: Normal range of motion.     Cervical back: Normal range of motion.  Skin:    General: Skin is warm.  Neurological:     General: No focal deficit present.     Mental Status: He is alert.  Psychiatric:        Mood and Affect: Mood normal.      EKG:   Recent Labs: No results found for requested  labs within last 365 days.    Lipid Panel    Component Value Date/Time   CHOL 139 05/01/2020 0836   TRIG 58 05/01/2020 0836   HDL 41 05/01/2020 0836   CHOLHDL 3.4 05/01/2020 0836   VLDL 12 05/01/2020 0836   LDLCALC 86 05/01/2020 0836      Other studies Reviewed: Additional studies/ records that were reviewed today include:  Review of the above records demonstrates:       No data to display            ASSESSMENT AND PLAN:    ICD-10-CM   1. Paroxysmal atrial fibrillation (HCC)  I48.0 MYOCARDIAL PERFUSION IMAGING    PCV ECHOCARDIOGRAM COMPLETE    2. Essential hypertension  I10 MYOCARDIAL PERFUSION IMAGING    PCV ECHOCARDIOGRAM COMPLETE    3. Sick sinus syndrome (HCC)  I49.5 MYOCARDIAL PERFUSION IMAGING    PCV ECHOCARDIOGRAM COMPLETE    4. Pacemaker  Z95.0 MYOCARDIAL PERFUSION IMAGING    PCV ECHOCARDIOGRAM COMPLETE    5. Syncope, unspecified syncope type  R55    DO ECHO AND STRESS TEST, AS PPM CHECK REVIELD9.4 YEARS BATTERY LIFE , NO FAILURES PACING       Problem List Items Addressed This Visit       Cardiovascular and Mediastinum   Sick sinus syndrome (HCC)   Relevant Orders   MYOCARDIAL PERFUSION IMAGING   PCV ECHOCARDIOGRAM COMPLETE   A-fib (HCC) - Primary   Relevant Orders   MYOCARDIAL PERFUSION IMAGING   PCV ECHOCARDIOGRAM COMPLETE   Essential hypertension   Relevant Orders   MYOCARDIAL PERFUSION IMAGING   PCV ECHOCARDIOGRAM COMPLETE     Other   Pacemaker   Relevant Orders   MYOCARDIAL PERFUSION IMAGING   PCV ECHOCARDIOGRAM COMPLETE   Other Visit Diagnoses     Syncope, unspecified syncope type       DO ECHO AND STRESS TEST, AS PPM CHECK REVIELD9.4 YEARS BATTERY LIFE , NO FAILURES PACING          Disposition:   Return in about 4 weeks (around 11/02/2023) for  STRESS TEST AND ECHO AND F/U CHECK.    Total time spent: 30 minutes  Signed,  Adrian Blackwater, MD  10/05/2023 10:48 AM    [facility]

## 2023-10-11 ENCOUNTER — Encounter: Payer: Self-pay | Admitting: Cardiovascular Disease

## 2023-10-25 ENCOUNTER — Ambulatory Visit (INDEPENDENT_AMBULATORY_CARE_PROVIDER_SITE_OTHER): Payer: Medicaid Other

## 2023-10-25 DIAGNOSIS — Z950 Presence of cardiac pacemaker: Secondary | ICD-10-CM

## 2023-10-25 DIAGNOSIS — I495 Sick sinus syndrome: Secondary | ICD-10-CM

## 2023-10-25 DIAGNOSIS — I10 Essential (primary) hypertension: Secondary | ICD-10-CM

## 2023-10-25 DIAGNOSIS — I351 Nonrheumatic aortic (valve) insufficiency: Secondary | ICD-10-CM

## 2023-10-25 DIAGNOSIS — I361 Nonrheumatic tricuspid (valve) insufficiency: Secondary | ICD-10-CM

## 2023-10-25 DIAGNOSIS — I340 Nonrheumatic mitral (valve) insufficiency: Secondary | ICD-10-CM

## 2023-10-25 DIAGNOSIS — I480 Paroxysmal atrial fibrillation: Secondary | ICD-10-CM

## 2023-11-01 ENCOUNTER — Ambulatory Visit: Payer: Medicaid Other | Admitting: Cardiovascular Disease

## 2023-11-08 ENCOUNTER — Ambulatory Visit (INDEPENDENT_AMBULATORY_CARE_PROVIDER_SITE_OTHER): Payer: Medicaid Other

## 2023-11-08 DIAGNOSIS — I495 Sick sinus syndrome: Secondary | ICD-10-CM | POA: Diagnosis not present

## 2023-11-08 DIAGNOSIS — Z950 Presence of cardiac pacemaker: Secondary | ICD-10-CM

## 2023-11-08 DIAGNOSIS — I480 Paroxysmal atrial fibrillation: Secondary | ICD-10-CM | POA: Diagnosis not present

## 2023-11-08 DIAGNOSIS — I10 Essential (primary) hypertension: Secondary | ICD-10-CM

## 2023-11-08 MED ORDER — TECHNETIUM TC 99M SESTAMIBI GENERIC - CARDIOLITE
33.4000 | Freq: Once | INTRAVENOUS | Status: AC | PRN
  Administered 2023-11-08: 33.4 via INTRAVENOUS

## 2023-11-08 MED ORDER — TECHNETIUM TC 99M SESTAMIBI GENERIC - CARDIOLITE
10.8000 | Freq: Once | INTRAVENOUS | Status: AC | PRN
  Administered 2023-11-08: 10.8 via INTRAVENOUS

## 2023-11-11 ENCOUNTER — Encounter: Payer: Self-pay | Admitting: Cardiovascular Disease

## 2023-11-11 ENCOUNTER — Ambulatory Visit: Payer: Medicaid Other | Admitting: Cardiovascular Disease

## 2023-11-11 VITALS — BP 104/84 | HR 60 | Ht 69.0 in | Wt 195.6 lb

## 2023-11-11 DIAGNOSIS — I340 Nonrheumatic mitral (valve) insufficiency: Secondary | ICD-10-CM

## 2023-11-11 DIAGNOSIS — I495 Sick sinus syndrome: Secondary | ICD-10-CM | POA: Diagnosis not present

## 2023-11-11 DIAGNOSIS — I10 Essential (primary) hypertension: Secondary | ICD-10-CM

## 2023-11-11 DIAGNOSIS — Z9989 Dependence on other enabling machines and devices: Secondary | ICD-10-CM | POA: Diagnosis not present

## 2023-11-11 DIAGNOSIS — I480 Paroxysmal atrial fibrillation: Secondary | ICD-10-CM

## 2023-11-11 NOTE — Progress Notes (Signed)
Cardiology Office Note   Date:  11/11/2023   ID:  Adrian Flores, DOB Feb 24, 1966, MRN [id number]  PCP:  Pcp, No  Cardiologist:  Adrian Blackwater, MD      History of Present Illness: Adrian Flores is a 57 y.o. male who presents for  Chief Complaint  Patient presents with   Follow-up    Echo and NST results    Feeling good, no chest pain, no syncope/palpitation.      Past Medical History:  Diagnosis Date   A-fib (HCC)    Bradycardia    COPD (chronic obstructive pulmonary disease) (HCC)    COVID-19 virus infection 11/2020   GERD (gastroesophageal reflux disease)    Hypertension    Leaky heart valve    Overweight (BMI 25.0-29.9)    Sleep apnea      Past Surgical History:  Procedure Laterality Date   correction eyelid retraction w/ implantation upper eyelid load     PACEMAKER INSERTION Left 03/23/2019   Procedure: INSERTION PACEMAKER DUAL CHAMBER;  Surgeon: Marcina Millard, MD;  Location: [facility] OR[facility];  Service: Cardiovascular;  Laterality: Left;     Current Outpatient Medications  Medication Sig Dispense Refill   enalapril (VASOTEC) 2.5 MG tablet TAKE 1 TABLET BY MOUTH EVERY DAY 90 tablet 1   pantoprazole (PROTONIX) 40 MG tablet TAKE 1 TABLET BY MOUTH TWICE A DAY 180 tablet 2   sotalol (BETAPACE) 80 MG tablet TAKE 1 TABLET BY MOUTH TWICE A DAY 180 tablet 1   No current facility-administered medications for this visit.    Allergies:   Patient has no known allergies.    Social History:   reports that he has quit smoking. He has never used smokeless tobacco. He reports that he does not currently use drugs. He reports that he does not drink alcohol.   Family History:  family history includes Heart disease in his mother.    ROS:     Review of Systems  Constitutional: Negative.   HENT: Negative.    Eyes: Negative.   Respiratory: Negative.    Gastrointestinal: Negative.   Genitourinary: Negative.   Musculoskeletal: Negative.   Skin:  Negative.   Neurological: Negative.   Endo/Heme/Allergies: Negative.   Psychiatric/Behavioral: Negative.    All other systems reviewed and are negative.     All other systems are reviewed and negative.    PHYSICAL EXAM: VS:  BP 104/84   Pulse 60   Ht 5\' 9"  (1.753 m)   Wt 195 lb 9.6 oz (88.7 kg)   SpO2 94%   BMI 28.89 kg/m  , BMI Body mass index is 28.89 kg/m. Last weight:  Wt Readings from Last 3 Encounters:  11/11/23 195 lb 9.6 oz (88.7 kg)  10/05/23 197 lb 3.2 oz (89.4 kg)  07/05/23 197 lb (89.4 kg)     Physical Exam Vitals reviewed.  Constitutional:      Appearance: Normal appearance. He is normal weight.  HENT:     Head: Normocephalic.     Nose: Nose normal.     Mouth/Throat:     Mouth: Mucous membranes are moist.  Eyes:     Pupils: Pupils are equal, round, and reactive to light.  Cardiovascular:     Rate and Rhythm: Normal rate and regular rhythm.     Pulses: Normal pulses.     Heart sounds: Normal heart sounds.  Pulmonary:     Effort: Pulmonary effort is normal.  Abdominal:     General: Abdomen is flat.  Bowel sounds are normal.  Musculoskeletal:        General: Normal range of motion.     Cervical back: Normal range of motion.  Skin:    General: Skin is warm.  Neurological:     General: No focal deficit present.     Mental Status: He is alert.  Psychiatric:        Mood and Affect: Mood normal.       EKG:   Recent Labs: No results found for requested labs within last 365 days.    Lipid Panel    Component Value Date/Time   CHOL 139 05/01/2020 0836   TRIG 58 05/01/2020 0836   HDL 41 05/01/2020 0836   CHOLHDL 3.4 05/01/2020 0836   VLDL 12 05/01/2020 0836   LDLCALC 86 05/01/2020 0836      Other studies Reviewed: Additional studies/ records that were reviewed today include:  Review of the above records demonstrates:       No data to display            ASSESSMENT AND PLAN:    ICD-10-CM   1. Sick sinus syndrome (HCC)  I49.5      2. Primary hypertension  I10     3. Paroxysmal atrial fibrillation (HCC)  I48.0    In NSR, no afib    4. CPAP (continuous positive airway pressure) dependence  Z99.89     5. Nonrheumatic mitral valve regurgitation  I34.0    mild yo mod, normal EF on echo, stress test normal       Problem List Items Addressed This Visit       Cardiovascular and Mediastinum   Sick sinus syndrome (HCC) - Primary   A-fib (HCC)   Hypertension     Other   CPAP (continuous positive airway pressure) dependence   Other Visit Diagnoses     Nonrheumatic mitral valve regurgitation       mild yo mod, normal EF on echo, stress test normal          Disposition:   Return in about 4 months (around 03/10/2024).    Total time spent: 30 minutes  Signed,  Adrian Blackwater, MD  11/11/2023 1:32 PM    [facility]

## 2023-11-21 ENCOUNTER — Other Ambulatory Visit: Payer: Self-pay | Admitting: Cardiovascular Disease

## 2023-11-22 ENCOUNTER — Telehealth: Payer: Self-pay

## 2023-11-22 NOTE — Telephone Encounter (Signed)
Patient left a voice mail requesting a refill on his Omeprazole. He doesn't have a PCP. However, he is taking Pantoprazole not Omeprazole. Please advise.

## 2024-02-20 ENCOUNTER — Other Ambulatory Visit: Payer: Self-pay | Admitting: Cardiovascular Disease

## 2024-02-20 DIAGNOSIS — I4891 Unspecified atrial fibrillation: Secondary | ICD-10-CM

## 2024-03-10 ENCOUNTER — Ambulatory Visit: Payer: Medicaid Other | Admitting: Cardiovascular Disease

## 2024-03-10 ENCOUNTER — Encounter: Payer: Self-pay | Admitting: Cardiovascular Disease

## 2024-03-10 VITALS — BP 128/76 | HR 60 | Ht 69.0 in | Wt 197.0 lb

## 2024-03-10 DIAGNOSIS — Z950 Presence of cardiac pacemaker: Secondary | ICD-10-CM | POA: Diagnosis not present

## 2024-03-10 DIAGNOSIS — R55 Syncope and collapse: Secondary | ICD-10-CM

## 2024-03-10 DIAGNOSIS — I480 Paroxysmal atrial fibrillation: Secondary | ICD-10-CM

## 2024-03-10 DIAGNOSIS — I10 Essential (primary) hypertension: Secondary | ICD-10-CM

## 2024-03-10 DIAGNOSIS — I495 Sick sinus syndrome: Secondary | ICD-10-CM

## 2024-03-10 DIAGNOSIS — I340 Nonrheumatic mitral (valve) insufficiency: Secondary | ICD-10-CM

## 2024-03-10 NOTE — Progress Notes (Signed)
 Cardiology Office Note   Date:  03/10/2024   ID:  Adrian Flores, DOB 10/01/1966, MRN [id number]  PCP:  Pcp, No  Cardiologist:  Adrian Blackwater, MD      History of Present Illness: Adrian Flores is a 58 y.o. male who presents for  Chief Complaint  Patient presents with   Follow-up    4 month follow up   2/11- episode    Had 02/01/24, felt like he was going to pass out.      Past Medical History:  Diagnosis Date   A-fib (HCC)    Bradycardia    COPD (chronic obstructive pulmonary disease) (HCC)    COVID-19 virus infection 11/2020   GERD (gastroesophageal reflux disease)    Hypertension    Leaky heart valve    Overweight (BMI 25.0-29.9)    Sleep apnea      Past Surgical History:  Procedure Laterality Date   correction eyelid retraction w/ implantation upper eyelid load     PACEMAKER INSERTION Left 03/23/2019   Procedure: INSERTION PACEMAKER DUAL CHAMBER;  Surgeon: Marcina Millard, MD;  Location: [facility] OR[facility];  Service: Cardiovascular;  Laterality: Left;     Current Outpatient Medications  Medication Sig Dispense Refill   enalapril (VASOTEC) 2.5 MG tablet TAKE 1 TABLET BY MOUTH EVERY DAY 90 tablet 1   pantoprazole (PROTONIX) 40 MG tablet TAKE 1 TABLET BY MOUTH TWICE A DAY 180 tablet 2   sotalol (BETAPACE) 80 MG tablet TAKE 1 TABLET BY MOUTH TWICE A DAY 180 tablet 1   No current facility-administered medications for this visit.    Allergies:   Patient has no known allergies.    Social History:   reports that he has quit smoking. He has never used smokeless tobacco. He reports that he does not currently use drugs. He reports that he does not drink alcohol.   Family History:  family history includes Heart disease in his mother.    ROS:     Review of Systems  Constitutional: Negative.   HENT: Negative.    Eyes: Negative.   Respiratory: Negative.    Gastrointestinal: Negative.   Genitourinary: Negative.   Musculoskeletal: Negative.    Skin: Negative.   Neurological: Negative.   Endo/Heme/Allergies: Negative.   Psychiatric/Behavioral: Negative.    All other systems reviewed and are negative.     All other systems are reviewed and negative.    PHYSICAL EXAM: VS:  BP 128/76   Pulse 60   Ht 5\' 9"  (1.753 m)   Wt 197 lb (89.4 kg)   SpO2 98%   BMI 29.09 kg/m  , BMI Body mass index is 29.09 kg/m. Last weight:  Wt Readings from Last 3 Encounters:  03/10/24 197 lb (89.4 kg)  11/11/23 195 lb 9.6 oz (88.7 kg)  10/05/23 197 lb 3.2 oz (89.4 kg)     Physical Exam Vitals reviewed.  Constitutional:      Appearance: Normal appearance. He is normal weight.  HENT:     Head: Normocephalic.     Nose: Nose normal.     Mouth/Throat:     Mouth: Mucous membranes are moist.  Eyes:     Pupils: Pupils are equal, round, and reactive to light.  Cardiovascular:     Rate and Rhythm: Normal rate and regular rhythm.     Pulses: Normal pulses.     Heart sounds: Normal heart sounds.  Pulmonary:     Effort: Pulmonary effort is normal.  Abdominal:  General: Abdomen is flat. Bowel sounds are normal.  Musculoskeletal:        General: Normal range of motion.     Cervical back: Normal range of motion.  Skin:    General: Skin is warm.  Neurological:     General: No focal deficit present.     Mental Status: He is alert.  Psychiatric:        Mood and Affect: Mood normal.       EKG:   Recent Labs: No results found for requested labs within last 365 days.    Lipid Panel    Component Value Date/Time   CHOL 139 05/01/2020 0836   TRIG 58 05/01/2020 0836   HDL 41 05/01/2020 0836   CHOLHDL 3.4 05/01/2020 0836   VLDL 12 05/01/2020 0836   LDLCALC 86 05/01/2020 0836      Other studies Reviewed: Additional studies/ records that were reviewed today include:  Review of the above records demonstrates:       No data to display            ASSESSMENT AND PLAN:    ICD-10-CM   1. Pacemaker  Z95.0    stress test  echo last year normal, PPM check up was unremarkable with 9 years battery life 1/25    2. Sick sinus syndrome (HCC)  I49.5     3. Primary hypertension  I10     4. Paroxysmal atrial fibrillation (HCC)  I48.0     5. Nonrheumatic mitral valve regurgitation  I34.0     6. Syncope, unspecified syncope type  R55        Problem List Items Addressed This Visit       Cardiovascular and Mediastinum   Sick sinus syndrome (HCC)   A-fib (HCC)   Hypertension     Other   Pacemaker - Primary   Other Visit Diagnoses       Nonrheumatic mitral valve regurgitation         Syncope, unspecified syncope type              Disposition:   Return in about 3 months (around 06/10/2024).    Total time spent: 30 minutes  Signed,  Adrian Blackwater, MD  03/10/2024 9:14 AM    [facility]

## 2024-05-18 ENCOUNTER — Other Ambulatory Visit: Payer: Self-pay | Admitting: Cardiovascular Disease

## 2024-05-18 DIAGNOSIS — I4891 Unspecified atrial fibrillation: Secondary | ICD-10-CM

## 2024-06-12 ENCOUNTER — Ambulatory Visit (INDEPENDENT_AMBULATORY_CARE_PROVIDER_SITE_OTHER): Admitting: Cardiovascular Disease

## 2024-06-12 ENCOUNTER — Encounter: Payer: Self-pay | Admitting: Cardiovascular Disease

## 2024-06-12 VITALS — BP 112/81 | HR 60 | Ht 69.0 in | Wt 189.8 lb

## 2024-06-12 DIAGNOSIS — I480 Paroxysmal atrial fibrillation: Secondary | ICD-10-CM | POA: Diagnosis not present

## 2024-06-12 DIAGNOSIS — I495 Sick sinus syndrome: Secondary | ICD-10-CM | POA: Diagnosis not present

## 2024-06-12 DIAGNOSIS — Z9989 Dependence on other enabling machines and devices: Secondary | ICD-10-CM

## 2024-06-12 DIAGNOSIS — I10 Essential (primary) hypertension: Secondary | ICD-10-CM

## 2024-06-12 DIAGNOSIS — Z950 Presence of cardiac pacemaker: Secondary | ICD-10-CM

## 2024-06-12 NOTE — Progress Notes (Signed)
 Cardiology Office Note   Date:  06/12/2024   ID:  Adrian Flores, DOB 1966-09-02, MRN [id number]  PCP:  Pcp, No  Cardiologist:  Denyse Bathe, MD      History of Present Illness: Adrian Flores is a 58 y.o. male who presents for No chief complaint on file.   Doing well      Past Medical History:  Diagnosis Date   A-fib (HCC)    Bradycardia    COPD (chronic obstructive pulmonary disease) (HCC)    COVID-19 virus infection 11/2020   GERD (gastroesophageal reflux disease)    Hypertension    Leaky heart valve    Overweight (BMI 25.0-29.9)    Sleep apnea      Past Surgical History:  Procedure Laterality Date   correction eyelid retraction w/ implantation upper eyelid load     PACEMAKER INSERTION Left 03/23/2019   Procedure: INSERTION PACEMAKER DUAL CHAMBER;  Surgeon: Ammon Blunt, MD;  Location: [facility] ORS;  Service: Cardiovascular;  Laterality: Left;     Current Outpatient Medications  Medication Sig Dispense Refill   enalapril (VASOTEC) 2.5 MG tablet TAKE 1 TABLET BY MOUTH EVERY DAY 90 tablet 1   pantoprazole  (PROTONIX ) 40 MG tablet TAKE 1 TABLET BY MOUTH TWICE A DAY 180 tablet 2   sotalol (BETAPACE) 80 MG tablet TAKE 1 TABLET BY MOUTH TWICE A DAY 180 tablet 1   No current facility-administered medications for this visit.    Allergies:   Patient has no known allergies.    Social History:   reports that he has quit smoking. He has never used smokeless tobacco. He reports that he does not currently use drugs. He reports that he does not drink alcohol.   Family History:  family history includes Heart disease in his mother.    ROS:     Review of Systems  Constitutional: Negative.   HENT: Negative.    Eyes: Negative.   Respiratory: Negative.    Gastrointestinal: Negative.   Genitourinary: Negative.   Musculoskeletal: Negative.   Skin: Negative.   Neurological: Negative.   Endo/Heme/Allergies: Negative.   Psychiatric/Behavioral:  Negative.    All other systems reviewed and are negative.     All other systems are reviewed and negative.    PHYSICAL EXAM: VS:  BP 112/81   Pulse 60   Ht 5' 9 (1.753 m)   Wt 189 lb 12.8 oz (86.1 kg)   SpO2 98%   BMI 28.03 kg/m  , BMI Body mass index is 28.03 kg/m. Last weight:  Wt Readings from Last 3 Encounters:  06/12/24 189 lb 12.8 oz (86.1 kg)  03/10/24 197 lb (89.4 kg)  11/11/23 195 lb 9.6 oz (88.7 kg)     Physical Exam Vitals reviewed.  Constitutional:      Appearance: Normal appearance. He is normal weight.  HENT:     Head: Normocephalic.     Nose: Nose normal.     Mouth/Throat:     Mouth: Mucous membranes are moist.   Eyes:     Pupils: Pupils are equal, round, and reactive to light.    Cardiovascular:     Rate and Rhythm: Normal rate and regular rhythm.     Pulses: Normal pulses.     Heart sounds: Normal heart sounds.  Pulmonary:     Effort: Pulmonary effort is normal.  Abdominal:     General: Abdomen is flat. Bowel sounds are normal.   Musculoskeletal:        General:  Normal range of motion.     Cervical back: Normal range of motion.   Skin:    General: Skin is warm.   Neurological:     General: No focal deficit present.     Mental Status: He is alert.   Psychiatric:        Mood and Affect: Mood normal.       EKG:   Recent Labs: No results found for requested labs within last 365 days.    Lipid Panel    Component Value Date/Time   CHOL 139 05/01/2020 0836   TRIG 58 05/01/2020 0836   HDL 41 05/01/2020 0836   CHOLHDL 3.4 05/01/2020 0836   VLDL 12 05/01/2020 0836   LDLCALC 86 05/01/2020 0836      Other studies Reviewed: Additional studies/ records that were reviewed today include:  Review of the above records demonstrates:       No data to display            ASSESSMENT AND PLAN:    ICD-10-CM   1. Paroxysmal atrial fibrillation (HCC)  I48.0    Sinus rhythm    2. Essential hypertension  I10     3. Sick  sinus syndrome (HCC)  I49.5     4. Pacemaker  Z95.0     5. 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    Total time spent: 30 minutes  Signed,  Denyse Bathe, MD  06/12/2024 9:32 AM    [facility]

## 2024-09-05 ENCOUNTER — Other Ambulatory Visit: Payer: Self-pay | Admitting: Cardiology

## 2024-09-05 DIAGNOSIS — I4891 Unspecified atrial fibrillation: Secondary | ICD-10-CM

## 2024-09-12 ENCOUNTER — Encounter: Payer: Self-pay | Admitting: Cardiovascular Disease

## 2024-09-12 ENCOUNTER — Ambulatory Visit (INDEPENDENT_AMBULATORY_CARE_PROVIDER_SITE_OTHER): Admitting: Cardiovascular Disease

## 2024-09-12 VITALS — BP 118/62 | HR 60 | Ht 69.0 in | Wt 187.0 lb

## 2024-09-12 DIAGNOSIS — I10 Essential (primary) hypertension: Secondary | ICD-10-CM

## 2024-09-12 DIAGNOSIS — Z950 Presence of cardiac pacemaker: Secondary | ICD-10-CM

## 2024-09-12 DIAGNOSIS — I480 Paroxysmal atrial fibrillation: Secondary | ICD-10-CM | POA: Diagnosis not present

## 2024-09-12 DIAGNOSIS — I340 Nonrheumatic mitral (valve) insufficiency: Secondary | ICD-10-CM | POA: Diagnosis not present

## 2024-09-12 DIAGNOSIS — I495 Sick sinus syndrome: Secondary | ICD-10-CM

## 2024-09-12 DIAGNOSIS — Z9989 Dependence on other enabling machines and devices: Secondary | ICD-10-CM

## 2024-09-12 NOTE — Progress Notes (Signed)
 Cardiology Office Note   Date:  09/12/2024   ID:  Adrian Flores, DOB 11/06/1966, MRN [id number]  PCP:  Pcp, No  Cardiologist:  Denyse Bathe, MD      History of Present Illness: Adrian Flores is a 58 y.o. male who presents for  Chief Complaint  Patient presents with   Follow-up    3 months follow up    Doing well      Past Medical History:  Diagnosis Date   A-fib (HCC)    Bradycardia    COPD (chronic obstructive pulmonary disease) (HCC)    COVID-19 virus infection 11/2020   GERD (gastroesophageal reflux disease)    Hypertension    Leaky heart valve    Overweight (BMI 25.0-29.9)    Sleep apnea      Past Surgical History:  Procedure Laterality Date   correction eyelid retraction w/ implantation upper eyelid load     PACEMAKER INSERTION Left 03/23/2019   Procedure: INSERTION PACEMAKER DUAL CHAMBER;  Surgeon: Ammon Blunt, MD;  Location: [facility] OR[facility];  Service: Cardiovascular;  Laterality: Left;     Current Outpatient Medications  Medication Sig Dispense Refill   enalapril (VASOTEC) 2.5 MG tablet TAKE 1 TABLET BY MOUTH EVERY DAY 90 tablet 1   pantoprazole  (PROTONIX ) 40 MG tablet TAKE 1 TABLET BY MOUTH TWICE A DAY 180 tablet 2   sotalol (BETAPACE) 80 MG tablet TAKE 1 TABLET BY MOUTH TWICE A DAY 180 tablet 1   No current facility-administered medications for this visit.    Allergies:   Patient has no known allergies.    Social History:   reports that he has quit smoking. He has never used smokeless tobacco. He reports that he does not currently use drugs. He reports that he does not drink alcohol.   Family History:  family history includes Heart disease in his mother.    ROS:     Review of Systems  Constitutional: Negative.   HENT: Negative.    Eyes: Negative.   Respiratory: Negative.    Gastrointestinal: Negative.   Genitourinary: Negative.   Musculoskeletal: Negative.   Skin: Negative.   Neurological: Negative.    Endo/Heme/Allergies: Negative.   Psychiatric/Behavioral: Negative.    All other systems reviewed and are negative.     All other systems are reviewed and negative.    PHYSICAL EXAM: VS:  BP 118/62   Pulse 60   Ht 5' 9 (1.753 m)   Wt 187 lb (84.8 kg)   SpO2 96%   BMI 27.62 kg/m  , BMI Body mass index is 27.62 kg/m. Last weight:  Wt Readings from Last 3 Encounters:  09/12/24 187 lb (84.8 kg)  06/12/24 189 lb 12.8 oz (86.1 kg)  03/10/24 197 lb (89.4 kg)     Physical Exam Vitals reviewed.  Constitutional:      Appearance: Normal appearance. He is normal weight.  HENT:     Head: Normocephalic.     Nose: Nose normal.     Mouth/Throat:     Mouth: Mucous membranes are moist.  Eyes:     Pupils: Pupils are equal, round, and reactive to light.  Cardiovascular:     Rate and Rhythm: Normal rate and regular rhythm.     Pulses: Normal pulses.     Heart sounds: Normal heart sounds.  Pulmonary:     Effort: Pulmonary effort is normal.  Abdominal:     General: Abdomen is flat. Bowel sounds are normal.  Musculoskeletal:  General: Normal range of motion.     Cervical back: Normal range of motion.  Skin:    General: Skin is warm.  Neurological:     General: No focal deficit present.     Mental Status: He is alert.  Psychiatric:        Mood and Affect: Mood normal.       EKG:   Recent Labs: No results found for requested labs within last 365 days.    Lipid Panel    Component Value Date/Time   CHOL 139 05/01/2020 0836   TRIG 58 05/01/2020 0836   HDL 41 05/01/2020 0836   CHOLHDL 3.4 05/01/2020 0836   VLDL 12 05/01/2020 0836   LDLCALC 86 05/01/2020 0836      Other studies Reviewed: Additional studies/ records that were reviewed today include:  Review of the above records demonstrates:       No data to display            ASSESSMENT AND PLAN:    ICD-10-CM   1. Sick sinus syndrome (HCC)  I49.5     2. Essential hypertension  I10     3.  Paroxysmal atrial fibrillation (HCC)  I48.0    in NSR    4. Pacemaker  Z95.0     5. 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    6. Primary hypertension  I10     7. Nonrheumatic mitral valve regurgitation  I34.0        Problem List Items Addressed This Visit       Cardiovascular and Mediastinum   Sick sinus syndrome (HCC) - Primary   A-fib (HCC)   Essential hypertension   Hypertension     Other   Pacemaker   CPAP (continuous positive airway pressure) dependence   Other Visit Diagnoses       Nonrheumatic mitral valve regurgitation              Disposition:   Return in about 3 months (around 12/12/2024).    Total time spent: 35 minutes  Signed,  Denyse Bathe, MD  09/12/2024 9:58 AM    [facility]

## 2024-11-19 ENCOUNTER — Other Ambulatory Visit: Payer: Self-pay | Admitting: Cardiovascular Disease

## 2024-11-20 ENCOUNTER — Other Ambulatory Visit: Payer: Self-pay

## 2024-11-20 MED ORDER — ENALAPRIL MALEATE 2.5 MG PO TABS: 2.5000 mg | ORAL_TABLET | Freq: Every day | ORAL | 1 refills | Status: AC

## 2024-12-11 ENCOUNTER — Ambulatory Visit: Admitting: Cardiovascular Disease

## 2024-12-11 ENCOUNTER — Encounter: Payer: Self-pay | Admitting: Cardiovascular Disease

## 2024-12-11 VITALS — BP 112/74 | HR 81 | Ht 69.0 in | Wt 194.8 lb

## 2024-12-11 DIAGNOSIS — I495 Sick sinus syndrome: Secondary | ICD-10-CM | POA: Diagnosis not present

## 2024-12-11 DIAGNOSIS — R55 Syncope and collapse: Secondary | ICD-10-CM | POA: Diagnosis not present

## 2024-12-11 DIAGNOSIS — I480 Paroxysmal atrial fibrillation: Secondary | ICD-10-CM

## 2024-12-11 DIAGNOSIS — Z9989 Dependence on other enabling machines and devices: Secondary | ICD-10-CM

## 2024-12-11 DIAGNOSIS — I340 Nonrheumatic mitral (valve) insufficiency: Secondary | ICD-10-CM | POA: Diagnosis not present

## 2024-12-11 DIAGNOSIS — Z950 Presence of cardiac pacemaker: Secondary | ICD-10-CM | POA: Diagnosis not present

## 2024-12-11 DIAGNOSIS — I10 Essential (primary) hypertension: Secondary | ICD-10-CM

## 2024-12-11 NOTE — Progress Notes (Signed)
 "     Cardiology Office Note   Date:  12/11/2024   ID:  Adrian Flores, DOB 1966-03-19, MRN [id number]  PCP:  Pcp, No  Cardiologist:  Denyse Bathe, MD      History of Present Illness: Adrian Flores is a 58 y.o. male who presents for  Chief Complaint  Patient presents with   Follow-up    3 month follow up    Has palpitation intermittently, no chest pain or SOB.      Past Medical History:  Diagnosis Date   A-fib (HCC)    Bradycardia    COPD (chronic obstructive pulmonary disease) (HCC)    COVID-19 virus infection 11/2020   GERD (gastroesophageal reflux disease)    Hypertension    Leaky heart valve    Overweight (BMI 25.0-29.9)    Sleep apnea      Past Surgical History:  Procedure Laterality Date   correction eyelid retraction w/ implantation upper eyelid load     PACEMAKER INSERTION Left 03/23/2019   Procedure: INSERTION PACEMAKER DUAL CHAMBER;  Surgeon: Ammon Blunt, MD;  Location: [facility] OR[facility];  Service: Cardiovascular;  Laterality: Left;     Current Outpatient Medications  Medication Sig Dispense Refill   enalapril  (VASOTEC ) 2.5 MG tablet TAKE 1 TABLET BY MOUTH EVERY DAY 90 tablet 1   enalapril  (VASOTEC ) 2.5 MG tablet Take 1 tablet (2.5 mg total) by mouth daily. 90 tablet 1   pantoprazole  (PROTONIX ) 40 MG tablet TAKE 1 TABLET BY MOUTH TWICE A DAY 180 tablet 2   sotalol (BETAPACE) 80 MG tablet TAKE 1 TABLET BY MOUTH TWICE A DAY 180 tablet 1   No current facility-administered medications for this visit.    Allergies:   Patient has no known allergies.    Social History:   reports that he has quit smoking. He has never used smokeless tobacco. He reports that he does not currently use drugs. He reports that he does not drink alcohol.   Family History:  family history includes Heart disease in his mother.    ROS:     Review of Systems  Constitutional: Negative.   HENT: Negative.    Eyes: Negative.   Respiratory: Negative.     Gastrointestinal: Negative.   Genitourinary: Negative.   Musculoskeletal: Negative.   Skin: Negative.   Neurological: Negative.   Endo/Heme/Allergies: Negative.   Psychiatric/Behavioral: Negative.    All other systems reviewed and are negative.     All other systems are reviewed and negative.    PHYSICAL EXAM: VS:  BP 112/74   Pulse 81   Ht 5' 9 (1.753 m)   Wt 194 lb 12.8 oz (88.4 kg)   SpO2 97%   BMI 28.77 kg/m  , BMI Body mass index is 28.77 kg/m. Last weight:  Wt Readings from Last 3 Encounters:  12/11/24 194 lb 12.8 oz (88.4 kg)  09/12/24 187 lb (84.8 kg)  06/12/24 189 lb 12.8 oz (86.1 kg)     Physical Exam Vitals reviewed.  Constitutional:      Appearance: Normal appearance. He is normal weight.  HENT:     Head: Normocephalic.     Nose: Nose normal.     Mouth/Throat:     Mouth: Mucous membranes are moist.  Eyes:     Pupils: Pupils are equal, round, and reactive to light.  Cardiovascular:     Rate and Rhythm: Normal rate and regular rhythm.     Pulses: Normal pulses.     Heart sounds: Normal  heart sounds.  Pulmonary:     Effort: Pulmonary effort is normal.  Abdominal:     General: Abdomen is flat. Bowel sounds are normal.  Musculoskeletal:        General: Normal range of motion.     Cervical back: Normal range of motion.  Skin:    General: Skin is warm.  Neurological:     General: No focal deficit present.     Mental Status: He is alert.  Psychiatric:        Mood and Affect: Mood normal.       EKG:   Recent Labs: No results found for requested labs within last 365 days.    Lipid Panel    Component Value Date/Time   CHOL 139 05/01/2020 0836   TRIG 58 05/01/2020 0836   HDL 41 05/01/2020 0836   CHOLHDL 3.4 05/01/2020 0836   VLDL 12 05/01/2020 0836   LDLCALC 86 05/01/2020 0836      Other studies Reviewed: Additional studies/ records that were reviewed today include:  Review of the above records demonstrates:       No data to  display            ASSESSMENT AND PLAN:    ICD-10-CM   1. Sick sinus syndrome (HCC)  I49.5 PCV ECHOCARDIOGRAM COMPLETE    2. Essential hypertension  I10 PCV ECHOCARDIOGRAM COMPLETE    3. Paroxysmal atrial fibrillation (HCC)  I48.0 PCV ECHOCARDIOGRAM COMPLETE   When have paplpitation take extra dose sotolol.    4. Pacemaker  Z95.0 PCV ECHOCARDIOGRAM COMPLETE    5. 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    6. Primary hypertension  I10 PCV ECHOCARDIOGRAM COMPLETE    7. Nonrheumatic mitral valve regurgitation  I34.0 PCV ECHOCARDIOGRAM COMPLETE    8. Syncope, unspecified syncope type  R55 PCV ECHOCARDIOGRAM COMPLETE       Problem List Items Addressed This Visit       Cardiovascular and Mediastinum   Sick sinus syndrome (HCC) - Primary   Relevant Orders   PCV ECHOCARDIOGRAM COMPLETE   A-fib (HCC)   Relevant Orders   PCV ECHOCARDIOGRAM COMPLETE   Essential hypertension   Relevant Orders   PCV ECHOCARDIOGRAM COMPLETE   Hypertension   Relevant Orders   PCV ECHOCARDIOGRAM COMPLETE     Other   Pacemaker   Relevant Orders   PCV ECHOCARDIOGRAM COMPLETE   CPAP (continuous positive airway pressure) dependence   Relevant Orders   PCV ECHOCARDIOGRAM COMPLETE   Other Visit Diagnoses       Nonrheumatic mitral valve regurgitation       Relevant Orders   PCV ECHOCARDIOGRAM COMPLETE     Syncope, unspecified syncope type       Relevant Orders   PCV ECHOCARDIOGRAM COMPLETE          Disposition:   Return in about 6 weeks (around 01/22/2025) for echo and f/u.    Total time spent: 30 minutes  Signed,  Denyse Bathe, MD  12/11/2024 9:29 AM    [facility] "

## 2025-01-17 ENCOUNTER — Other Ambulatory Visit

## 2025-01-22 ENCOUNTER — Other Ambulatory Visit

## 2025-01-26 ENCOUNTER — Ambulatory Visit

## 2025-01-26 DIAGNOSIS — Z9989 Dependence on other enabling machines and devices: Secondary | ICD-10-CM

## 2025-01-26 DIAGNOSIS — I10 Essential (primary) hypertension: Secondary | ICD-10-CM

## 2025-01-26 DIAGNOSIS — R55 Syncope and collapse: Secondary | ICD-10-CM

## 2025-01-26 DIAGNOSIS — I495 Sick sinus syndrome: Secondary | ICD-10-CM

## 2025-01-26 DIAGNOSIS — I480 Paroxysmal atrial fibrillation: Secondary | ICD-10-CM

## 2025-01-26 DIAGNOSIS — I340 Nonrheumatic mitral (valve) insufficiency: Secondary | ICD-10-CM

## 2025-01-26 DIAGNOSIS — Z950 Presence of cardiac pacemaker: Secondary | ICD-10-CM

## 2025-01-30 ENCOUNTER — Ambulatory Visit: Admitting: Cardiovascular Disease
